# Patient Record
Sex: Female | Born: 1940 | Race: Black or African American | Hispanic: No | Marital: Single | Smoking: Never smoker
Health system: Southern US, Community
[De-identification: ages and names within clinical notes are randomized; demographics above are authoritative.]

## PROBLEM LIST (undated history)

## (undated) DIAGNOSIS — C259 Malignant neoplasm of pancreas, unspecified: Secondary | ICD-10-CM

## (undated) DIAGNOSIS — Z808 Family history of malignant neoplasm of other organs or systems: Secondary | ICD-10-CM

## (undated) DIAGNOSIS — Z8042 Family history of malignant neoplasm of prostate: Secondary | ICD-10-CM

## (undated) HISTORY — DX: Family history of malignant neoplasm of other organs or systems: Z80.8

## (undated) HISTORY — DX: Family history of malignant neoplasm of prostate: Z80.42

---

## 2021-07-03 NOTE — Progress Notes (Signed)
I spoke with Barbara Byrd.  I introduced myself and let her know we have received a referral from Dr Sheila Oats office for f/u of her history of pancreatic cancer.  I told her we need her records from her previous oncologist for review prior to scheduling her appt.  I provided my direct fax number.  She states she will call her previous oncologists office and request records to be faxed.

## 2021-07-17 NOTE — Progress Notes (Signed)
I spoke with Tillie Rung the referral coordinator at Dr Sheila Oats office.  Letting her know we have reached out to [facility] requesting her medical records regarding her cancer care in [place].  I let Tillie Rung know we have not received those records.  She has sent a message to Dr Donnamarie Poag and his nurse.  I provided my contact information.

## 2021-07-18 ENCOUNTER — Other Ambulatory Visit: Payer: Self-pay

## 2021-07-18 ENCOUNTER — Ambulatory Visit: Payer: Medicare HMO | Admitting: Podiatry

## 2021-07-18 ENCOUNTER — Encounter: Payer: Self-pay | Admitting: Podiatry

## 2021-07-18 DIAGNOSIS — M79674 Pain in right toe(s): Secondary | ICD-10-CM | POA: Diagnosis not present

## 2021-07-18 DIAGNOSIS — M79675 Pain in left toe(s): Secondary | ICD-10-CM | POA: Diagnosis not present

## 2021-07-18 DIAGNOSIS — B351 Tinea unguium: Secondary | ICD-10-CM | POA: Diagnosis not present

## 2021-07-18 NOTE — Progress Notes (Signed)
This patient presents to the office for evaluation and treatment of long thick painful nails .  This patient is unable to trim her own nails since the patient cannot reach her feet.  Patient says the nails are painful walking and wearing his shoes. She has moved from [place] to this area.  She presents for preventive foot care services.  General Appearance  Alert, conversant and in no acute stress.  Vascular  Dorsalis pedis and posterior tibial  pulses are palpable  bilaterally.  Capillary return is within normal limits  bilaterally. Temperature is within normal limits  bilaterally.  Neurologic  Senn-Weinstein monofilament wire test within normal limits  bilaterally. Muscle power within normal limits bilaterally.  Nails Thick disfigured discolored nails with subungual debris  from hallux to fifth toes bilaterally. No evidence of bacterial infection or drainage bilaterally.  Orthopedic  No limitations of motion  feet .  No crepitus or effusions noted.  No bony pathology or digital deformities noted.  Skin  normotropic skin with no porokeratosis noted bilaterally.  No signs of infections or ulcers noted.     Onychomycosis  Pain in toes right foot  Pain in toes left foot  Debridement  of nails  1-5  B/L with a nail nipper.  Nails were then filed using a dremel tool with no incidents.    RTC  3 months    Gardiner Barefoot DPM

## 2021-08-12 ENCOUNTER — Encounter: Payer: Self-pay | Admitting: *Deleted

## 2021-08-13 ENCOUNTER — Encounter: Payer: Self-pay | Admitting: *Deleted

## 2021-08-13 NOTE — Progress Notes (Signed)
Barbara Byrd brought her health records to [facility], will review with Dr. Benay Spice and set up appointment to establish care

## 2021-08-27 ENCOUNTER — Ambulatory Visit
Admission: RE | Admit: 2021-08-27 | Discharge: 2021-08-27 | Disposition: A | Discharge status: Home / Self Care | Payer: Self-pay | Source: Ambulatory Visit | Attending: Oncology | Admitting: Oncology

## 2021-08-27 ENCOUNTER — Other Ambulatory Visit: Payer: Self-pay | Admitting: *Deleted

## 2021-08-27 DIAGNOSIS — C259 Malignant neoplasm of pancreas, unspecified: Secondary | ICD-10-CM

## 2021-09-03 ENCOUNTER — Encounter: Payer: Self-pay | Admitting: *Deleted

## 2021-09-03 NOTE — Progress Notes (Signed)
Called and confirmed appt with patient for tomorrow at 1:40. Also received message from [facility] radiology that they received CD of images from [place] and are uploading them into [facility]. As I have been unable to obtain treatment records from [place], Barbara Byrd gave me Dr Jerline Pain cell phone number and called that number this am and left voicemail for records to be faxed ASAP. Will continue to follow

## 2021-09-04 ENCOUNTER — Inpatient Hospital Stay: Payer: Medicare HMO | Attending: Oncology | Admitting: Oncology

## 2021-09-04 ENCOUNTER — Other Ambulatory Visit: Payer: Self-pay

## 2021-09-04 ENCOUNTER — Inpatient Hospital Stay: Payer: Medicare HMO

## 2021-09-04 ENCOUNTER — Encounter: Payer: Self-pay | Admitting: *Deleted

## 2021-09-04 ENCOUNTER — Other Ambulatory Visit: Payer: Self-pay | Admitting: *Deleted

## 2021-09-04 VITALS — BP 147/71 | HR 67 | Temp 98.2°F | Resp 18 | Ht 64.0 in | Wt 275.6 lb

## 2021-09-04 DIAGNOSIS — C221 Intrahepatic bile duct carcinoma: Secondary | ICD-10-CM | POA: Diagnosis not present

## 2021-09-04 DIAGNOSIS — C259 Malignant neoplasm of pancreas, unspecified: Secondary | ICD-10-CM

## 2021-09-04 DIAGNOSIS — Z8616 Personal history of COVID-19: Secondary | ICD-10-CM

## 2021-09-04 DIAGNOSIS — Z90710 Acquired absence of both cervix and uterus: Secondary | ICD-10-CM

## 2021-09-04 DIAGNOSIS — I10 Essential (primary) hypertension: Secondary | ICD-10-CM | POA: Diagnosis not present

## 2021-09-04 DIAGNOSIS — Z79899 Other long term (current) drug therapy: Secondary | ICD-10-CM | POA: Diagnosis not present

## 2021-09-04 DIAGNOSIS — E785 Hyperlipidemia, unspecified: Secondary | ICD-10-CM | POA: Insufficient documentation

## 2021-09-04 NOTE — Progress Notes (Signed)
efer

## 2021-09-04 NOTE — Progress Notes (Signed)
Met with Barbara Byrd during her visit with Dr Benay Spice, after today's visit we have arranged for Genetic counseling on 10/6, CA 19.9 lab today and referral to IR for Port Removal. She will F/U with Dr Benay Spice in 6 months or sooner if needed. She has my contact information.

## 2021-09-04 NOTE — Progress Notes (Signed)
[facility] New Patient Consult   Requesting MD: Malena Peer, Md [place],  [place] [address]   [name] 80 y.o.  1941-01-19    Reason for Consult: Cholangiocarcinoma   HPI: (Medical records provided by the patient) Barbara Byrd reports developing difficulty swallowing and jaundice in the summer 2019.  An MRI on 07/02/2018 for evaluation of elevated liver function test revealed moderate intrahepatic biliary dilation and abrupt stricture of a dilated common bile duct.  An MRCP revealed a high-grade stricture of the distal common bile duct causing significant intra hepatic and extrahepatic biliary dilation without a dilated pancreatic duct.  An ERCP/EUS procedure on 09/02/2018 revealed a single stenosis in the lower third of the main bile duct.  The bile duct was diffusely dilated.  A stent was placed in the common bile duct.  A mass was identified in the uncinate process of the pancreas.  The borders were poorly defined.  A biopsy of the pancreas uncinate mass revealed a rare minute cluster of atypical cells.  She underwent a Whipple procedure on 11/15/2018.  The tumor involved the common bile duct and measure 2.1 cm.  The tumor was a moderately differentiated adenocarcinoma invading the duodenum and pancreas.  Resection margins are negative.  Perineural invasion was present.  No lymphovascular invasion.  2/35 lymph nodes contained metastatic carcinoma.  The tumor was staged as a pT2pN1 of the common bile duct.  She was treated with adjuvant gemcitabine/capecitabine for 6 months (we do not have the chemotherapy records available today).  She reports tolerating the treatment well.  A Port-A-Cath remains in place.  The Port-A-Cath has not been flushed in months. Barbara Byrd recently relocated to [facility] and is referred for oncology care.  Past medical history: "Arthritis "of the knees Hyperlipidemia Hypertension G2 Ab2 COVID infection in 2021 History  of a right breast biopsy  Past surgical history: Hysterectomy Pancreaticoduodenectomy December 2019  Medications: Reviewed  Allergies: No Known Allergies  Family history: No family history of cancer  Social History:   She lives with 3 sisters in [place].  She is retired Radio producer.  She does not use cigarettes.  She reports social alcohol use.  She was transfused at the time of pancreas surgery.  No risk factor for HIV or hepatitis.  She has received COVID-19 vaccines  ROS:   Positives include: Intermittent "sweats "during the day and at night, bilateral knee and foot pain when ambulating, decreased visual acuity  A complete ROS was otherwise negative.  Physical Exam:  Blood pressure (!) 147/71, pulse 67, temperature 98.2 F (36.8 C), temperature source Oral, resp. rate 18, height 5\' 4"  (1.626 m), weight 275 lb 9.6 oz (125 kg), SpO2 99 %.  HEENT: Neck without mass Lungs: Coarse end inspiratory rhonchi at the lower posterior chest bilaterally, no respiratory distress Cardiac: Regular rate and rhythm Abdomen: No mass, no hepatosplenomegaly, nontender  Vascular: No leg edema Lymph nodes: No cervical, supraclavicular, axillary, or inguinal nodes Neurologic: Alert and oriented, motor exam appears intact in the upper and lower extremities bilaterally Skin: No rash Musculoskeletal: No spine tenderness Right upper chest Port-A-Cath without erythema  Lab: CA 19-9: 09/19/18-184    07/18/20- 12.6  CBC 07/16/2020-heme-onc 12.8, MCV 93, platelets 223,000, white count 7.6, ANC 4.6  Assessment/Plan:   Common bile duct cholangiocarcinoma-stage IIb,pT2pN1 Presenting with obstructive jaundice summer 2019 Elevated preoperative CA 19-9 ERCP/EUS 09/02/2018-stenosis and lower third of the main bile duct, mass in the uncinate of the pancreas,-biopsy revealed  minute focus of atypical cells Pancreaticoduodenectomy 11/15/2018,pT2pN1 moderately differentiated adenocarcinoma of the common bile  duct, 2/35 lymph nodes, perineural invasion Patient reports 6 months of adjuvant gemcitabine/capecitabine 2.  Hypertension 3.  Hyperlipidemia 4.  COVID-19 2021  Disposition:   Barbara Byrd was diagnosed with adenocarcinoma the extrahepatic bile duct when she underwent a pancreaticoduodenectomy on 11/15/2018.  She completed adjuvant chemotherapy and is in clinical remission.  She is almost 3 years out from diagnosis.  We will obtain a CA 19-9 today.  She will be referred for removal of the Port-A-Cath.  Barbara Byrd will return for an office visit in 6 months.  I encouraged her to obtain a mammogram and influenza vaccine.  [name], MD  09/04/2021, 1:59 PM

## 2021-09-05 LAB — CANCER ANTIGEN 19-9: CA 19-9: 58 U/mL — ABNORMAL HIGH (ref 0–35)

## 2021-09-08 ENCOUNTER — Telehealth: Payer: Self-pay

## 2021-09-08 NOTE — Telephone Encounter (Signed)
-----   Message from Ladell Pier, MD sent at 09/05/2021  3:57 PM EDT ----- Please call patient, the tumor marker for bile duct cancer is very mildly elevated, likely a benign nonspecific finding, repeat CA 19-9 and office visit in 2-3 months

## 2021-09-11 ENCOUNTER — Other Ambulatory Visit: Payer: Self-pay | Admitting: Genetic Counselor

## 2021-09-11 ENCOUNTER — Inpatient Hospital Stay: Payer: Medicare HMO

## 2021-09-11 ENCOUNTER — Encounter: Payer: Self-pay | Admitting: Genetic Counselor

## 2021-09-11 ENCOUNTER — Inpatient Hospital Stay: Payer: Medicare HMO | Attending: Oncology | Admitting: Genetic Counselor

## 2021-09-11 ENCOUNTER — Other Ambulatory Visit: Payer: Self-pay

## 2021-09-11 DIAGNOSIS — C221 Intrahepatic bile duct carcinoma: Secondary | ICD-10-CM

## 2021-09-11 DIAGNOSIS — Z808 Family history of malignant neoplasm of other organs or systems: Secondary | ICD-10-CM | POA: Diagnosis not present

## 2021-09-11 DIAGNOSIS — Z8042 Family history of malignant neoplasm of prostate: Secondary | ICD-10-CM

## 2021-09-11 NOTE — Progress Notes (Signed)
REFERRING PROVIDER: Ladell Pier, MD [place],  [place] [address]  PRIMARY PROVIDER:  Malena Peer, MD  PRIMARY REASON FOR VISIT:  1. Cholangiocarcinoma ([place])   2. Family history of cancer of pituitary gland and craniopharyngeal duct   3. Family history of prostate cancer      HISTORY OF PRESENT ILLNESS:   Barbara Byrd, a 80 y.o. female, was seen for a [place] cancer genetics consultation at the request of Dr. Benay Spice due to a personal and family history of cancer.  Ms. Ahlgrim presents to clinic today to discuss the possibility of a hereditary predisposition to cancer, genetic testing, and to further clarify her future cancer risks, as well as potential cancer risks for family members.   In 2019, at the age of 32, Ms. Corne was diagnosed with adenocarcinoma of the head of the pancreas. The treatment plan included chemotherapy and a whipple procedure.      CANCER HISTORY:  Oncology History  Cholangiocarcinoma ([place])  09/04/2021 Initial Diagnosis   Cholangiocarcinoma ([place])   09/04/2021 Cancer Staging   Staging form: Distal Bile Duct, AJCC 8th Edition - Pathologic: Stage IIB (pT2, pN1, cM0) - Signed by Ladell Pier, MD on 09/04/2021 Histologic grade (G): G2 Histologic grading system: 3 grade system Residual tumor (R): R0 - None Organs invaded adjacent to distal bile duct: Duodenum, Pancreas Perineural invasion (PNI): Present      RISK FACTORS:  Menarche was at age 30-13.  First live birth at age N/A.  OCP use for approximately 0 years.  Ovaries intact: yes.  Hysterectomy: yes.  Menopausal status: postmenopausal.  HRT use: 0 years. Colonoscopy: yes;  some polyps . Mammogram within the last year: yes. Number of breast biopsies: 2. Up to date with pelvic exams: yes. Any excessive radiation exposure in the past: no  Past Medical History:  Diagnosis Date   Family history of cancer of pituitary gland and craniopharyngeal duct    Family  history of prostate cancer     No past surgical history on file.  Social History   Socioeconomic History   Marital status: Single    Spouse name: Not on file   Number of children: Not on file   Years of education: Not on file   Highest education level: Not on file  Occupational History   Not on file  Tobacco Use   Smoking status: Never   Smokeless tobacco: Not on file  Vaping Use   Vaping Use: Never used  Substance and Sexual Activity   Alcohol use: Yes    Comment: socially   Drug use: Never   Sexual activity: Not on file  Other Topics Concern   Not on file  Social History Narrative   Not on file   Social Determinants of Health   Financial Resource Strain: Not on file  Food Insecurity: Not on file  Transportation Needs: Not on file  Physical Activity: Not on file  Stress: Not on file  Social Connections: Not on file     FAMILY HISTORY:  We obtained a detailed, 4-generation family history.  Significant diagnoses are listed below: Family History  Problem Relation Age of Onset   Heart disease Mother 73   Cerebral aneurysm Father 17   Brain cancer Brother 88       pituitary tumor   Stroke Maternal Grandmother    Brain cancer Nephew 38       pituitary gland   Prostate cancer Nephew 14  The patient had 5 sisters and 3 brothers. One brother had a pituitary gland tumor in his late 79's and his son also had that same tumor in his late 86's.  One sister has a son who had prostate cancer at 62, and another brother has a son who had possible colon cancer vs other bowel disease necessitating a colostomy.  The parents are both deceased.  The patients mother died at 44 from heart diease.  She had two sisters and a brother who did not have cancer but one sister's daughter had cancer NOS.  The maternal grandparents died of old age and a stroke.  The patient's father died at 76 from a cerebral hemorrhage.  He had 6 siblings who are reportedly cancer free.  His parents died  of unknown causes.  Ms. Botto is unaware of previous family history of genetic testing for hereditary cancer risks. Patient's maternal ancestors are of African American descent, and paternal ancestors are of African American descent. There is no reported Ashkenazi Jewish ancestry. There is no known consanguinity.  GENETIC COUNSELING ASSESSMENT: Ms. Yeske is a 80 y.o. female with a personal and family history of cancer which is somewhat suggestive of a hereditary cancer syndrome and predisposition to cancer given her diagnosis of pancreatic cancer combined with other cancers in the family, some at young ages. We, therefore, discussed and recommended the following at today's visit.   DISCUSSION: We discussed that, in general, most cancer is not inherited in families, but instead is sporadic or familial. Sporadic cancers occur by chance and typically happen at older ages (>50 years) as this type of cancer is caused by genetic changes acquired during an individual's lifetime. Some families have more cancers than would be expected by chance; however, the ages or types of cancer are not consistent with a known genetic mutation or known genetic mutations have been ruled out. This type of familial cancer is thought to be due to a combination of multiple genetic, environmental, hormonal, and lifestyle factors. While this combination of factors likely increases the risk of cancer, the exact source of this risk is not currently identifiable or testable.  We discussed that up to 15% of pancreatic cancer is hereditary, with most cases associated with BRCA mutations.  There are other genes that can be associated with hereditary pancreatic cancer syndromes.  These include ATM and PALB2.  We discussed that testing is beneficial for several reasons including knowing how to follow individuals after completing their treatment, identifying whether potential treatment options such as PARP inhibitors would be beneficial, and  understand if other family members could be at risk for cancer and allow them to undergo genetic testing.   We reviewed the characteristics, features and inheritance patterns of hereditary cancer syndromes. We also discussed genetic testing, including the appropriate family members to test, the process of testing, insurance coverage and turn-around-time for results. We discussed the implications of a negative, positive, carrier and/or variant of uncertain significant result. We recommended Ms. Brune pursue genetic testing for the CancerNext-Expanded+RNAinsight gene panel.   The CancerNext-Expanded gene panel offered by [facility] and includes sequencing and rearrangement analysis for the following 77 genes: AIP, ALK, APC*, ATM*, AXIN2, BAP1, BARD1, BLM, BMPR1A, BRCA1*, BRCA2*, BRIP1*, CDC73, CDH1*, CDK4, CDKN1B, CDKN2A, CHEK2*, CTNNA1, DICER1, FANCC, FH, FLCN, GALNT12, KIF1B, LZTR1, MAX, MEN1, MET, MLH1*, MSH2*, MSH3, MSH6*, MUTYH*, NBN, NF1*, NF2, NTHL1, PALB2*, PHOX2B, PMS2*, POT1, PRKAR1A, PTCH1, PTEN*, RAD51C*, RAD51D*, RB1, RECQL, RET, SDHA, SDHAF2, SDHB, SDHC, SDHD, SMAD4, SMARCA4, SMARCB1,  SMARCE1, STK11, SUFU, TMEM127, TP53*, TSC1, TSC2, VHL and XRCC2 (sequencing and deletion/duplication); EGFR, EGLN1, HOXB13, KIT, MITF, PDGFRA, POLD1, and POLE (sequencing only); EPCAM and GREM1 (deletion/duplication only). DNA and RNA analyses performed for * genes.   Based on Ms. Giovannetti's personal and family history of cancer, she meets medical criteria for genetic testing. Despite that she meets criteria, she may still have an out of pocket cost. We discussed that if her out of pocket cost for testing is over $100, the laboratory will call and confirm whether she wants to proceed with testing.  If the out of pocket cost of testing is less than $100 she will be billed by the genetic testing laboratory.   PLAN: After considering the risks, benefits, and limitations, Ms. Tigges provided informed consent to  pursue genetic testing and the blood sample was sent to [facility] for analysis of the CancerNext-Expanded+RNAinsight. Results should be available within approximately 2-3 weeks' time, at which point they will be disclosed by telephone to Ms. Barbar, as will any additional recommendations warranted by these results. Ms. Lapaglia will receive a summary of her genetic counseling visit and a copy of her results once available. This information will also be available in Epic.   Lastly, we encouraged Ms. Kirsh to remain in contact with cancer genetics annually so that we can continuously update the family history and inform her of any changes in cancer genetics and testing that may be of benefit for this family.   Ms. Spohr questions were answered to her satisfaction today. Our contact information was provided should additional questions or concerns arise. Thank you for the referral and allowing [place] to share in the care of your patient.   Lenyx Boody P. [place], [place], [facility] Licensed, Insurance risk surveyor [email or web address] phone: [phone]  The patient was seen for a total of 40 minutes in face-to-face genetic counseling.  The patient brought her sister. This patient was discussed with Drs. Magrinat, Lindi Adie and/or Burr Medico who agrees with the above.    _______________________________________________________________________ For Office Staff:  Number of people involved in session: 2 Was an Intern/ student involved with case: no

## 2021-09-12 LAB — GENETIC SCREENING ORDER

## 2021-09-25 ENCOUNTER — Encounter: Payer: Self-pay | Admitting: Genetic Counselor

## 2021-09-25 ENCOUNTER — Telehealth: Payer: Self-pay | Admitting: Genetic Counselor

## 2021-09-25 ENCOUNTER — Ambulatory Visit: Payer: Self-pay | Admitting: Genetic Counselor

## 2021-09-25 DIAGNOSIS — Z1379 Encounter for other screening for genetic and chromosomal anomalies: Secondary | ICD-10-CM | POA: Insufficient documentation

## 2021-09-25 NOTE — Progress Notes (Signed)
HPI:  Ms. Virtue was previously seen in the [place] clinic due to a personal and family history of cancer and concerns regarding a hereditary predisposition to cancer. Please refer to our prior cancer genetics clinic note for more information regarding our discussion, assessment and recommendations, at the time. Ms. Shaub recent genetic test results were disclosed to her, as were recommendations warranted by these results. These results and recommendations are discussed in more detail below.  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  The report date is September 23, 2021.  The CancerNext-Expanded gene panel offered by [facility] and includes sequencing and rearrangement analysis for the following 77 genes: AIP, ALK, APC*, ATM*, AXIN2, BAP1, BARD1, BLM, BMPR1A, BRCA1*, BRCA2*, BRIP1*, CDC73, CDH1*, CDK4, CDKN1B, CDKN2A, CHEK2*, CTNNA1, DICER1, FANCC, FH, FLCN, GALNT12, KIF1B, LZTR1, MAX, MEN1, MET, MLH1*, MSH2*, MSH3, MSH6*, MUTYH*, NBN, NF1*, NF2, NTHL1, PALB2*, PHOX2B, PMS2*, POT1, PRKAR1A, PTCH1, PTEN*, RAD51C*, RAD51D*, RB1, RECQL, RET, SDHA, SDHAF2, SDHB, SDHC, SDHD, SMAD4, SMARCA4, SMARCB1, SMARCE1, STK11, SUFU, TMEM127, TP53*, TSC1, TSC2, VHL and XRCC2 (sequencing and deletion/duplication); EGFR, EGLN1, HOXB13, KIT, MITF, PDGFRA, POLD1, and POLE (sequencing only); EPCAM and GREM1 (deletion/duplication only). DNA and RNA analyses performed for *  genes.      FAMILY HISTORY:  We obtained a detailed, 4-generation family history.  Significant diagnoses are listed below: Family History  Problem Relation Age of Onset   Heart disease Mother 75   Cerebral aneurysm Father 88   Brain cancer Brother 67       pituitary tumor   Stroke Maternal Grandmother    Brain cancer Nephew 72       pituitary gland   Prostate cancer Nephew 46     The patient had 5 sisters and 3 brothers. One brother had a pituitary gland tumor in his late 22's and his son also had that same tumor in his late 67's.  One sister has a son who had prostate cancer at 75, and another brother has a son who had possible colon cancer vs other bowel disease necessitating a colostomy.  The parents are both deceased.   The patients mother died at 41 from heart diease.  She had two sisters and a brother who did not have cancer but one sister's daughter had cancer NOS.  The maternal grandparents died of old age and a stroke.   The patient's father died at 49 from a cerebral hemorrhage.  He had 6 siblings who are reportedly cancer free.  His parents died of unknown causes.   Ms. Mottley is unaware of previous family history of genetic testing for hereditary cancer risks. Patient's maternal ancestors are of African American descent, and paternal ancestors are of African American descent. There is no reported Ashkenazi Jewish ancestry. There is no known consanguinity.   GENETIC TEST RESULTS: Genetic testing reported out on September 23, 2021 through the CancerNext-Expanded+RNAinsight cancer panel found no pathogenic mutations. The CancerNext-Expanded gene panel offered by [facility] and includes sequencing and rearrangement analysis for the following  77 genes: AIP, ALK, APC*, ATM*, AXIN2, BAP1, BARD1, BLM, BMPR1A, BRCA1*, BRCA2*, BRIP1*, CDC73, CDH1*, CDK4, CDKN1B, CDKN2A, CHEK2*, CTNNA1, DICER1, FANCC, FH, FLCN, GALNT12, KIF1B, LZTR1, MAX, MEN1, MET, MLH1*, MSH2*, MSH3, MSH6*, MUTYH*,  NBN, NF1*, NF2, NTHL1, PALB2*, PHOX2B, PMS2*, POT1, PRKAR1A, PTCH1, PTEN*, RAD51C*, RAD51D*, RB1, RECQL, RET, SDHA, SDHAF2, SDHB, SDHC, SDHD, SMAD4, SMARCA4, SMARCB1, SMARCE1, STK11, SUFU, TMEM127, TP53*, TSC1, TSC2, VHL and XRCC2 (sequencing and deletion/duplication); EGFR, EGLN1, HOXB13, KIT, MITF, PDGFRA, POLD1, and POLE (sequencing only); EPCAM and GREM1 (deletion/duplication only). DNA and RNA analyses performed for * gene. The test report has been scanned into EPIC and is located under the Molecular Pathology section of the Results Review tab.  A portion of the result report is included below for reference.     We discussed with Ms. Walmer that because current genetic testing is not perfect, it is possible there may be a gene mutation in one of these genes that current testing cannot detect, but that chance is small.  We also discussed, that there could be another gene that has not yet been discovered, or that we have not yet tested, that is responsible for the cancer diagnoses in the family. It is also possible there is a hereditary cause for the cancer in the family that Ms. Moster did not inherit and therefore was not identified in her testing.  Therefore, it is important to remain in touch with cancer genetics in the future so that we can continue to offer Ms. Severt the most up to date genetic testing.   ADDITIONAL GENETIC TESTING: We discussed with Ms. Krogh that her genetic testing was fairly extensive.  If there are genes identified to increase cancer risk that can be analyzed in the future, we would be happy to discuss and coordinate this testing at that time.    CANCER SCREENING RECOMMENDATIONS: Ms. Printup test result is considered negative (normal).  This means that we have not identified a hereditary cause for her personal and family history of cancer at this time. Most cancers happen by chance and this negative test suggests that her cancer may fall into this category.    While  reassuring, this does not definitively rule out a hereditary predisposition to cancer. It is still possible that there could be genetic mutations that are undetectable by current technology. There could be genetic mutations in genes that have not been tested or identified to increase cancer risk.  Therefore, it is recommended she continue to follow the cancer management and screening guidelines provided by her oncology and primary healthcare provider.   An individual's cancer risk and medical management are not determined by genetic test results alone. Overall cancer risk assessment incorporates additional factors, including personal medical history, family history, and any available genetic information that may result in a personalized plan for cancer prevention and surveillance  RECOMMENDATIONS FOR FAMILY MEMBERS:  Individuals in this family might be at some increased risk of developing cancer, over the general population risk, simply due to the family history of cancer.  We recommended women in this family have a yearly mammogram beginning at age 90, or 64 years younger than the earliest onset of cancer, an annual clinical breast exam, and perform monthly breast self-exams. Women in this family should also have a gynecological exam as recommended by their primary provider. All family members should be referred for colonoscopy starting at age 51.  FOLLOW-UP: Lastly, we discussed with Ms. Circle that cancer genetics is a rapidly advancing field and it  is possible that new genetic tests will be appropriate for her and/or her family members in the future. We encouraged her to remain in contact with cancer genetics on an annual basis so we can update her personal and family histories and let her know of advances in cancer genetics that may benefit this family.   Our contact number was provided. Ms. Varone questions were answered to her satisfaction, and she knows she is welcome to call us at anytime with  additional questions or concerns.   Roma Kayser, [place], [facility] Licensed, Certified Genetic Counselor [email or web address]

## 2021-09-25 NOTE — Telephone Encounter (Signed)
Revealed negative genetic testing.  Discussed that we do not know why she has bile duct cancer or why there is cancer in the family. It could be due to a different gene that we are not testing, or maybe our current technology may not be able to pick something up.  It will be important for her to keep in contact with genetics to keep up with whether additional testing may be needed.

## 2021-10-01 ENCOUNTER — Encounter: Payer: Self-pay | Admitting: Podiatry

## 2021-10-01 ENCOUNTER — Ambulatory Visit: Payer: Medicare HMO | Admitting: Podiatry

## 2021-10-01 ENCOUNTER — Other Ambulatory Visit: Payer: Self-pay

## 2021-10-01 DIAGNOSIS — B351 Tinea unguium: Secondary | ICD-10-CM

## 2021-10-01 DIAGNOSIS — M79675 Pain in left toe(s): Secondary | ICD-10-CM | POA: Diagnosis not present

## 2021-10-01 DIAGNOSIS — M79674 Pain in right toe(s): Secondary | ICD-10-CM | POA: Diagnosis not present

## 2021-10-01 NOTE — Progress Notes (Signed)
This patient presents to the office for evaluation and treatment of long thick painful nails .  This patient is unable to trim her own nails since the patient cannot reach her feet.  Patient says the nails are painful walking and wearing his shoes.  She presents for preventive foot care services.  General Appearance  Alert, conversant and in no acute stress.  Vascular  Dorsalis pedis and posterior tibial  pulses are palpable  bilaterally.  Capillary return is within normal limits  bilaterally. Temperature is within normal limits  bilaterally.  Neurologic  Senn-Weinstein monofilament wire test within normal limits  bilaterally. Muscle power within normal limits bilaterally.  Nails Thick disfigured discolored nails with subungual debris  from hallux to fifth toes bilaterally. No evidence of bacterial infection or drainage bilaterally.  Orthopedic  No limitations of motion  feet .  No crepitus or effusions noted.  No bony pathology or digital deformities noted.  Skin  normotropic skin with no porokeratosis noted bilaterally.  No signs of infections or ulcers noted.     Onychomycosis  Pain in toes right foot  Pain in toes left foot  Debridement  of nails  1-5  B/L with a nail nipper.  Nails were then filed using a dremel tool with no incidents.    RTC  3 months    Zakhari Fogel DPM   

## 2021-11-12 ENCOUNTER — Other Ambulatory Visit: Payer: Self-pay | Admitting: Student

## 2021-11-14 ENCOUNTER — Ambulatory Visit (HOSPITAL_COMMUNITY)
Admission: RE | Admit: 2021-11-14 | Discharge: 2021-11-14 | Disposition: A | Discharge status: Home / Self Care | Payer: Medicare HMO | Source: Ambulatory Visit | Attending: Oncology | Admitting: Oncology

## 2021-11-14 ENCOUNTER — Other Ambulatory Visit: Payer: Self-pay

## 2021-11-14 DIAGNOSIS — C259 Malignant neoplasm of pancreas, unspecified: Secondary | ICD-10-CM | POA: Insufficient documentation

## 2021-11-14 DIAGNOSIS — Z452 Encounter for adjustment and management of vascular access device: Secondary | ICD-10-CM | POA: Diagnosis present

## 2021-11-14 HISTORY — PX: IR REMOVAL TUN ACCESS W/ PORT W/O FL MOD SED: IMG2290

## 2021-11-14 MED ORDER — LIDOCAINE-EPINEPHRINE (PF) 2 %-1:200000 IJ SOLN
INTRAMUSCULAR | Status: AC
  Administered 2021-11-14: 10 mL
  Filled 2021-11-14: qty 20

## 2022-01-07 ENCOUNTER — Other Ambulatory Visit: Payer: Self-pay

## 2022-01-07 ENCOUNTER — Encounter: Payer: Self-pay | Admitting: Podiatry

## 2022-01-07 ENCOUNTER — Ambulatory Visit: Payer: Medicare HMO | Admitting: Podiatry

## 2022-01-07 DIAGNOSIS — M79674 Pain in right toe(s): Secondary | ICD-10-CM

## 2022-01-07 DIAGNOSIS — M79675 Pain in left toe(s): Secondary | ICD-10-CM | POA: Diagnosis not present

## 2022-01-07 DIAGNOSIS — B351 Tinea unguium: Secondary | ICD-10-CM | POA: Diagnosis not present

## 2022-01-07 NOTE — Progress Notes (Signed)
This patient presents to the office for evaluation and treatment of long thick painful nails .  This patient is unable to trim her own nails since the patient cannot reach her feet.  Patient says the nails are painful walking and wearing his shoes.  She presents for preventive foot care services.  General Appearance  Alert, conversant and in no acute stress.  Vascular  Dorsalis pedis and posterior tibial  pulses are palpable  bilaterally.  Capillary return is within normal limits  bilaterally. Temperature is within normal limits  bilaterally.  Neurologic  Senn-Weinstein monofilament wire test within normal limits  bilaterally. Muscle power within normal limits bilaterally.  Nails Thick disfigured discolored nails with subungual debris  from hallux to fifth toes bilaterally. No evidence of bacterial infection or drainage bilaterally.  Orthopedic  No limitations of motion  feet .  No crepitus or effusions noted.  No bony pathology or digital deformities noted.  Skin  normotropic skin with no porokeratosis noted bilaterally.  No signs of infections or ulcers noted.     Onychomycosis  Pain in toes right foot  Pain in toes left foot  Debridement  of nails  1-5  B/L with a nail nipper.  Nails were then filed using a dremel tool with no incidents.    RTC  3 months    Fabion Gatson DPM   

## 2022-02-23 ENCOUNTER — Inpatient Hospital Stay: Payer: Medicare HMO | Attending: Oncology

## 2022-02-23 ENCOUNTER — Encounter: Payer: Self-pay | Admitting: Nurse Practitioner

## 2022-02-23 ENCOUNTER — Inpatient Hospital Stay: Payer: Medicare HMO | Admitting: Nurse Practitioner

## 2022-02-23 ENCOUNTER — Other Ambulatory Visit: Payer: Self-pay

## 2022-02-23 VITALS — BP 135/70 | HR 67 | Temp 98.1°F | Resp 18 | Ht 64.0 in | Wt 275.0 lb

## 2022-02-23 DIAGNOSIS — R1011 Right upper quadrant pain: Secondary | ICD-10-CM | POA: Insufficient documentation

## 2022-02-23 DIAGNOSIS — E785 Hyperlipidemia, unspecified: Secondary | ICD-10-CM | POA: Insufficient documentation

## 2022-02-23 DIAGNOSIS — Z9221 Personal history of antineoplastic chemotherapy: Secondary | ICD-10-CM | POA: Insufficient documentation

## 2022-02-23 DIAGNOSIS — C221 Intrahepatic bile duct carcinoma: Secondary | ICD-10-CM | POA: Diagnosis not present

## 2022-02-23 DIAGNOSIS — Z8505 Personal history of malignant neoplasm of liver: Secondary | ICD-10-CM | POA: Diagnosis not present

## 2022-02-23 DIAGNOSIS — I10 Essential (primary) hypertension: Secondary | ICD-10-CM | POA: Diagnosis not present

## 2022-02-23 DIAGNOSIS — R918 Other nonspecific abnormal finding of lung field: Secondary | ICD-10-CM | POA: Diagnosis not present

## 2022-02-23 DIAGNOSIS — C259 Malignant neoplasm of pancreas, unspecified: Secondary | ICD-10-CM

## 2022-02-23 DIAGNOSIS — R978 Other abnormal tumor markers: Secondary | ICD-10-CM | POA: Insufficient documentation

## 2022-02-23 NOTE — Progress Notes (Signed)
?  [facility] ?OFFICE PROGRESS NOTE ? ? ?Diagnosis: Cholangiocarcinoma ? ?INTERVAL HISTORY:  ? ?Ms. Mccrackin contacted the office last week to request a sooner appointment to evaluate abdominal pain.  She reports fairly constant right upper to mid abdominal pain for the past 2 weeks.  Pain is described as dull.  Oral intake does not affect the pain.  Bowels moving regularly.  No bleeding.  No fever.  No nausea or vomiting.  Appetite is poor.  She is more fatigued.  She has noted dizziness at times. ? ?Objective: ? ?Vital signs in last 24 hours: ? ?Blood pressure 135/70, pulse 67, temperature 98.1 ?F (36.7 ?C), temperature source Oral, resp. rate 18, height '5\' 4"'$  (1.626 m), weight 275 lb (124.7 kg), SpO2 100 %. ?  ? ?HEENT: No thrush or ulcers.  Sclera anicteric. ?Lymphatics: No palpable cervical, supraclavicular or axillary lymph nodes. ?Resp: Lungs clear bilaterally. ?Cardio: Regular rate and rhythm. ?GI: Abdomen is soft.  Nontender.  No hepatosplenomegaly.  No mass. ?Vascular: No leg edema. ? ? ?Lab Results: ? ?No results found for: WBC, HGB, HCT, MCV, PLT, NEUTROABS ? ?Imaging: ? ?No results found. ? ?Medications: I have reviewed the patient's current medications. ? ?Assessment/Plan: ?Common bile duct cholangiocarcinoma-stage IIb,pT2pN1 ?Presenting with obstructive jaundice summer 2019 ?Elevated preoperative CA 19-9 ?ERCP/EUS 09/02/2018-stenosis and lower third of the main bile duct, mass in the uncinate of the pancreas,-biopsy revealed minute focus of atypical cells ?Pancreaticoduodenectomy 11/15/2018,pT2pN1 moderately differentiated adenocarcinoma of the common bile duct, 2/35 lymph nodes, perineural invasion ?Patient reports 6 months of adjuvant gemcitabine/capecitabine ?2.  Hypertension ?3.  Hyperlipidemia ?4.  COVID-19 2021 ? ?Disposition: Ms. Tolsma is now a little over 3 years out from a Whipple procedure for common bile duct cholangiocarcinoma, stage IIb.  She presents today with an approximate  2-week history of right upper to mid abdomen pain.  She   We discussed potential etiologies of the pain and decided to proceed with CT scans.  She will return for a follow-up visit next week to review results.  She declines pain medication. ? ? ? ?Ned Card ANP/GNP-BC  ? ?02/23/2022  ?1:42 PM ? ? ? ? ? ? ? ?

## 2022-02-24 LAB — CANCER ANTIGEN 19-9: CA 19-9: 551 U/mL — ABNORMAL HIGH (ref 0–35)

## 2022-02-27 ENCOUNTER — Telehealth: Payer: Self-pay

## 2022-02-27 ENCOUNTER — Inpatient Hospital Stay: Payer: Medicare HMO

## 2022-02-27 ENCOUNTER — Other Ambulatory Visit: Payer: Self-pay

## 2022-02-27 ENCOUNTER — Ambulatory Visit (HOSPITAL_BASED_OUTPATIENT_CLINIC_OR_DEPARTMENT_OTHER)
Admission: RE | Admit: 2022-02-27 | Discharge: 2022-02-27 | Disposition: A | Discharge status: Home / Self Care | Payer: Medicare HMO | Source: Ambulatory Visit | Attending: Nurse Practitioner | Admitting: Nurse Practitioner

## 2022-02-27 DIAGNOSIS — C221 Intrahepatic bile duct carcinoma: Secondary | ICD-10-CM | POA: Diagnosis present

## 2022-02-27 LAB — BASIC METABOLIC PANEL - CANCER CENTER ONLY
Anion gap: 10 (ref 5–15)
BUN: 12 mg/dL (ref 8–23)
CO2: 28 mmol/L (ref 22–32)
Calcium: 9.5 mg/dL (ref 8.9–10.3)
Chloride: 100 mmol/L (ref 98–111)
Creatinine: 0.89 mg/dL (ref 0.44–1.00)
GFR, Estimated: >60 mL/min (ref >60)
Glucose, Bld: 121 mg/dL — ABNORMAL HIGH (ref 70–99)
Potassium: 3.3 mmol/L — ABNORMAL LOW (ref 3.5–5.1)
Sodium: 138 mmol/L (ref 135–145)

## 2022-02-27 MED ORDER — IOHEXOL 300 MG/ML  SOLN
100.0000 mL | Freq: Once | INTRAMUSCULAR | Status: AC | PRN
  Administered 2022-02-27: 100 mL via INTRAVENOUS

## 2022-02-27 NOTE — Telephone Encounter (Signed)
-----   Message from Owens Shark, NP sent at 02/27/2022  1:18 PM EDT ----- ?Please forward lab from today to PCP.  She has mild hypokalemia.  Looks like she is on a diuretic. ?----- Message ----- ?From: Interface, Lab In [place] ?Sent: 02/27/2022   9:38 AM EDT ?To: Owens Shark, NP ? ? ?

## 2022-02-27 NOTE — Telephone Encounter (Signed)
Faxed over the lab result to Leota Sauers, MD at [phone] ?

## 2022-03-02 ENCOUNTER — Other Ambulatory Visit: Payer: Self-pay

## 2022-03-02 ENCOUNTER — Inpatient Hospital Stay: Payer: Medicare HMO | Admitting: Nurse Practitioner

## 2022-03-02 ENCOUNTER — Encounter: Payer: Self-pay | Admitting: Nurse Practitioner

## 2022-03-02 VITALS — BP 131/65 | HR 65 | Temp 98.7°F | Resp 18 | Ht 64.0 in | Wt 274.6 lb

## 2022-03-02 DIAGNOSIS — C221 Intrahepatic bile duct carcinoma: Secondary | ICD-10-CM

## 2022-03-02 DIAGNOSIS — Z8505 Personal history of malignant neoplasm of liver: Secondary | ICD-10-CM | POA: Diagnosis not present

## 2022-03-02 NOTE — Progress Notes (Signed)
?  [facility] ?OFFICE PROGRESS NOTE ? ? ?Diagnosis:  Cholangiocarcinoma ? ?INTERVAL HISTORY:  ? ?Barbara Byrd returns as scheduled.  She continues to have intermittent abdominal pain mainly located upper abdomen at times moving to the lower abdomen.  No nausea or vomiting.  Bowels moving regularly with Metamucil.  Overall good appetite. ? ?Objective: ? ?Vital signs in last 24 hours: ? ?Blood pressure 131/65, pulse 65, temperature 98.7 ?F (37.1 ?C), temperature source Oral, resp. rate 18, height '5\' 4"'$  (1.626 m), weight 274 lb 9.6 oz (124.6 kg), SpO2 99 %. ?  ? ?Resp: Lungs clear bilaterally. ?Cardio: Regular rate and rhythm. ?GI: Abdomen is soft.  Mild tenderness at the upper mid abdomen.  No hepatomegaly. ?Vascular: No leg edema. ? ? ?Lab Results: ? ?No results found for: WBC, HGB, HCT, MCV, PLT, NEUTROABS ? ?Imaging: ? ?No results found. ? ?Medications: I have reviewed the patient's current medications. ? ?Assessment/Plan: ?Common bile duct cholangiocarcinoma-stage IIb,pT2pN1 ?Presenting with obstructive jaundice summer 2019 ?Elevated preoperative CA 19-9 ?ERCP/EUS 09/02/2018-stenosis and lower third of the main bile duct, mass in the uncinate of the pancreas,-biopsy revealed minute focus of atypical cells ?Pancreaticoduodenectomy 11/15/2018,pT2pN1 moderately differentiated adenocarcinoma of the common bile duct, 2/35 lymph nodes, perineural invasion ?Patient reports 6 months of adjuvant gemcitabine/capecitabine ?Seen with complaint of abdominal pain 02/23/2022, CA 19-9 elevated, referred for CTs ?CTs 02/27/2022 status post Whipple without specific evidence of recurrent or metastatic disease.  Tiny pulmonary nodules not readily apparent on the outside CT of 07/27/2019.  No thoracic adenopathy.  Enlarged heterogeneous left lobe of the thyroid grossly similar to the prior exam. ?2.  Hypertension ?3.  Hyperlipidemia ?4.  COVID-19 2021 ? ?Disposition: Barbara Byrd has a history of cholangiocarcinoma dating to  2019.  She underwent a Whipple procedure December 2019 followed by 6 months of adjuvant chemotherapy.  She presented last week with complaint of abdominal pain.  CA 19-9 is elevated.  We referred her for CT scans.  Scan showed possible new tiny bilateral lung nodules.  No explanation for abdominal pain.  She will try an antacid.  She declines pain medication.  We will plan to see her back in approximately 3 weeks, refer to gastroenterology with persistent pain.  Repeat CA 19-9 at next visit.  She will contact the office with worsening abdominal pain, other problems. ? ?Patient seen with Dr. Benay Spice.  CT images reviewed on the computer with Barbara Byrd and her sister. ? ? ? ?Ned Card ANP/GNP-BC  ? ?03/02/2022  ?9:29 AM ?This was a shared visit with Ned Card.  Barbara Byrd was interviewed and examined.  She has abdominal discomfort and the CA 19-9 is elevated.  The restaging CT showed no clear evidence of disease progression.  The tiny lung nodules are most likely benign.  I reviewed the CT images with Barbara Byrd.  The plan is to continue observation and repeat the CA 19-9 in 3 weeks.  She will call for increased abdominal pain. ?We will consider a GI referral +/- a restaging PET if her symptoms persist. ? ?I was present for greater than 50% of today's visit.  I performed medical decision making. ? ?Julieanne Manson, MD ? ? ? ? ? ? ?

## 2022-03-03 ENCOUNTER — Inpatient Hospital Stay: Payer: Medicare HMO | Admitting: Oncology

## 2022-03-03 ENCOUNTER — Inpatient Hospital Stay: Payer: Medicare HMO

## 2022-03-23 ENCOUNTER — Other Ambulatory Visit: Payer: Self-pay | Admitting: Internal Medicine

## 2022-03-23 DIAGNOSIS — R7401 Elevation of levels of liver transaminase levels: Secondary | ICD-10-CM

## 2022-03-24 ENCOUNTER — Inpatient Hospital Stay: Payer: Medicare HMO | Attending: Oncology

## 2022-03-24 ENCOUNTER — Inpatient Hospital Stay: Payer: Medicare HMO | Admitting: Oncology

## 2022-03-24 VITALS — BP 125/68 | HR 73 | Temp 98.2°F | Resp 18 | Ht 64.0 in | Wt 267.0 lb

## 2022-03-24 DIAGNOSIS — Z8505 Personal history of malignant neoplasm of liver: Secondary | ICD-10-CM | POA: Diagnosis present

## 2022-03-24 DIAGNOSIS — E785 Hyperlipidemia, unspecified: Secondary | ICD-10-CM | POA: Diagnosis not present

## 2022-03-24 DIAGNOSIS — C221 Intrahepatic bile duct carcinoma: Secondary | ICD-10-CM

## 2022-03-24 DIAGNOSIS — I10 Essential (primary) hypertension: Secondary | ICD-10-CM | POA: Insufficient documentation

## 2022-03-24 DIAGNOSIS — R978 Other abnormal tumor markers: Secondary | ICD-10-CM | POA: Diagnosis not present

## 2022-03-24 DIAGNOSIS — R7401 Elevation of levels of liver transaminase levels: Secondary | ICD-10-CM | POA: Insufficient documentation

## 2022-03-24 LAB — CMP (CANCER CENTER ONLY)
ALT: 49 U/L — ABNORMAL HIGH (ref 0–44)
AST: 59 U/L — ABNORMAL HIGH (ref 15–41)
Albumin: 3.4 g/dL — ABNORMAL LOW (ref 3.5–5.0)
Alkaline Phosphatase: 510 U/L — ABNORMAL HIGH (ref 38–126)
Anion gap: 9 (ref 5–15)
BUN: 11 mg/dL (ref 8–23)
CO2: 27 mmol/L (ref 22–32)
Calcium: 10.1 mg/dL (ref 8.9–10.3)
Chloride: 102 mmol/L (ref 98–111)
Creatinine: 0.96 mg/dL (ref 0.44–1.00)
GFR, Estimated: >60 mL/min (ref >60)
Glucose, Bld: 151 mg/dL — ABNORMAL HIGH (ref 70–99)
Potassium: 3.8 mmol/L (ref 3.5–5.1)
Sodium: 138 mmol/L (ref 135–145)
Total Bilirubin: 1.2 mg/dL (ref 0.3–1.2)
Total Protein: 7.5 g/dL (ref 6.5–8.1)

## 2022-03-24 NOTE — Progress Notes (Signed)
?  [facility] ?OFFICE PROGRESS NOTE ? ? ?Diagnosis: Cholangiocarcinoma ? ?INTERVAL HISTORY:  ? ?Barbara Byrd returns as scheduled.  She reports partial improvement in abdominal discomfort.  Good appetite.  No difficulty with bowel function.  No bleeding.  ?Objective: ? ?Vital signs in last 24 hours: ? ?Blood pressure 125/68, pulse 73, temperature 98.2 ?F (36.8 ?C), temperature source Oral, resp. rate 18, height '5\' 4"'$  (1.626 m), weight 267 lb (121.1 kg), SpO2 98 %. ?  ? ?Lymphatics: No cervical, supraclavicular, axillary, or inguinal nodes ?Resp: Lungs clear bilaterally ?Cardio: Regular rate and rhythm ?GI: No hepatosplenomegaly, no apparent ascites, no mass ?Vascular: No leg edema ? ?Lab Results: ? ? ?CMP  ?Lab Results  ?Component Value Date  ? NA 138 02/27/2022  ? K 3.3 (L) 02/27/2022  ? CL 100 02/27/2022  ? CO2 28 02/27/2022  ? GLUCOSE 121 (H) 02/27/2022  ? BUN 12 02/27/2022  ? CREATININE 0.89 02/27/2022  ? CALCIUM 9.5 02/27/2022  ? GFRNONAA >60 02/27/2022  ? ? ?Lab Results  ?Component Value Date  ? [id number] (H) 02/23/2022  ? ?Medications: I have reviewed the patient's current medications. ? ? ?Assessment/Plan: ?Common bile duct cholangiocarcinoma-stage IIb,pT2pN1 ?Presenting with obstructive jaundice summer 2019 ?Elevated preoperative CA 19-9 ?ERCP/EUS 09/02/2018-stenosis and lower third of the main bile duct, mass in the uncinate of the pancreas,-biopsy revealed minute focus of atypical cells ?Pancreaticoduodenectomy 11/15/2018,pT2pN1 moderately differentiated adenocarcinoma of the common bile duct, 2/35 lymph nodes, perineural invasion ?Patient reports 6 months of adjuvant gemcitabine/capecitabine ?Seen with complaint of abdominal pain 02/23/2022, CA 19-9 elevated, referred for CTs ?CTs 02/27/2022 status post Whipple without specific evidence of recurrent or metastatic disease.  Tiny pulmonary nodules not readily apparent on the outside CT of 07/27/2019.  No thoracic adenopathy.  Enlarged  heterogeneous left lobe of the thyroid grossly similar to the prior exam. ?2.  Hypertension ?3.  Hyperlipidemia ?4.  COVID-19 2021 ? ? ?Disposition: ?Barbara Byrd has a history of cholangiocarcinoma.  The CA 19-9 was elevated last month.  We will follow-up on the CA 19-9 from today.  The liver enzymes are mildly elevated today.  Her clinical status has improved over the past month. ?She will return for an office and lab visit in 1 month.  The plan is to refer her for a staging PET scan if the tumor marker continues to rise. ? ?She will call in the interim for increased pain or new symptoms. ? ?[name], MD ? ?03/24/2022  ?9:35 AM ? ? ?

## 2022-03-25 LAB — CANCER ANTIGEN 19-9: CA 19-9: 700 U/mL — ABNORMAL HIGH (ref 0–35)

## 2022-04-08 ENCOUNTER — Ambulatory Visit: Payer: Medicare HMO | Admitting: Podiatry

## 2022-04-08 ENCOUNTER — Encounter: Payer: Self-pay | Admitting: Podiatry

## 2022-04-08 DIAGNOSIS — B351 Tinea unguium: Secondary | ICD-10-CM

## 2022-04-08 DIAGNOSIS — M79675 Pain in left toe(s): Secondary | ICD-10-CM

## 2022-04-08 DIAGNOSIS — M79674 Pain in right toe(s): Secondary | ICD-10-CM | POA: Diagnosis not present

## 2022-04-08 NOTE — Progress Notes (Signed)
This patient presents to the office for evaluation and treatment of long thick painful nails .  This patient is unable to trim her own nails since the patient cannot reach her feet.  Patient says the nails are painful walking and wearing his shoes.  She presents for preventive foot care services.  General Appearance  Alert, conversant and in no acute stress.  Vascular  Dorsalis pedis and posterior tibial  pulses are palpable  bilaterally.  Capillary return is within normal limits  bilaterally. Temperature is within normal limits  bilaterally.  Neurologic  Senn-Weinstein monofilament wire test within normal limits  bilaterally. Muscle power within normal limits bilaterally.  Nails Thick disfigured discolored nails with subungual debris  from hallux to fifth toes bilaterally. No evidence of bacterial infection or drainage bilaterally.  Orthopedic  No limitations of motion  feet .  No crepitus or effusions noted.  No bony pathology or digital deformities noted.  Skin  normotropic skin with no porokeratosis noted bilaterally.  No signs of infections or ulcers noted.     Onychomycosis  Pain in toes right foot  Pain in toes left foot  Debridement  of nails  1-5  B/L with a nail nipper.  Nails were then filed using a dremel tool with no incidents.    RTC  3 months    Eman Morimoto DPM   

## 2022-04-20 ENCOUNTER — Encounter: Payer: Self-pay | Admitting: Nurse Practitioner

## 2022-04-20 ENCOUNTER — Inpatient Hospital Stay: Payer: Medicare HMO | Attending: Oncology | Admitting: Nurse Practitioner

## 2022-04-20 ENCOUNTER — Inpatient Hospital Stay: Payer: Medicare HMO

## 2022-04-20 VITALS — BP 128/72 | HR 71 | Temp 98.2°F | Resp 18 | Ht 64.0 in | Wt 257.2 lb

## 2022-04-20 DIAGNOSIS — Z8505 Personal history of malignant neoplasm of liver: Secondary | ICD-10-CM | POA: Insufficient documentation

## 2022-04-20 DIAGNOSIS — E785 Hyperlipidemia, unspecified: Secondary | ICD-10-CM | POA: Diagnosis not present

## 2022-04-20 DIAGNOSIS — C221 Intrahepatic bile duct carcinoma: Secondary | ICD-10-CM

## 2022-04-20 DIAGNOSIS — R978 Other abnormal tumor markers: Secondary | ICD-10-CM | POA: Diagnosis not present

## 2022-04-20 DIAGNOSIS — Z9049 Acquired absence of other specified parts of digestive tract: Secondary | ICD-10-CM | POA: Diagnosis not present

## 2022-04-20 DIAGNOSIS — I10 Essential (primary) hypertension: Secondary | ICD-10-CM | POA: Insufficient documentation

## 2022-04-20 DIAGNOSIS — Z90411 Acquired partial absence of pancreas: Secondary | ICD-10-CM | POA: Insufficient documentation

## 2022-04-20 LAB — CMP (CANCER CENTER ONLY)
ALT: 38 U/L (ref 0–44)
AST: 56 U/L — ABNORMAL HIGH (ref 15–41)
Albumin: 3.3 g/dL — ABNORMAL LOW (ref 3.5–5.0)
Alkaline Phosphatase: 468 U/L — ABNORMAL HIGH (ref 38–126)
Anion gap: 11 (ref 5–15)
BUN: 15 mg/dL (ref 8–23)
CO2: 30 mmol/L (ref 22–32)
Calcium: 9.6 mg/dL (ref 8.9–10.3)
Chloride: 95 mmol/L — ABNORMAL LOW (ref 98–111)
Creatinine: 0.92 mg/dL (ref 0.44–1.00)
GFR, Estimated: >60 mL/min (ref >60)
Glucose, Bld: 136 mg/dL — ABNORMAL HIGH (ref 70–99)
Potassium: 3.2 mmol/L — ABNORMAL LOW (ref 3.5–5.1)
Sodium: 136 mmol/L (ref 135–145)
Total Bilirubin: 1.5 mg/dL — ABNORMAL HIGH (ref 0.3–1.2)
Total Protein: 7.9 g/dL (ref 6.5–8.1)

## 2022-04-20 LAB — CBC WITH DIFFERENTIAL (CANCER CENTER ONLY)
Abs Immature Granulocytes: 0.11 10*3/uL — ABNORMAL HIGH (ref 0.00–0.07)
Basophils Absolute: 0.1 10*3/uL (ref 0.0–0.1)
Basophils Relative: 1 %
Eosinophils Absolute: 0 10*3/uL (ref 0.0–0.5)
Eosinophils Relative: 0 %
HCT: 35.2 % — ABNORMAL LOW (ref 36.0–46.0)
Hemoglobin: 11.1 g/dL — ABNORMAL LOW (ref 12.0–15.0)
Immature Granulocytes: 1 %
Lymphocytes Relative: 12 %
Lymphs Abs: 1.4 10*3/uL (ref 0.7–4.0)
MCH: 28 pg (ref 26.0–34.0)
MCHC: 31.5 g/dL (ref 30.0–36.0)
MCV: 88.9 fL (ref 80.0–100.0)
Monocytes Absolute: 1 10*3/uL (ref 0.1–1.0)
Monocytes Relative: 9 %
Neutro Abs: 8.6 10*3/uL — ABNORMAL HIGH (ref 1.7–7.7)
Neutrophils Relative %: 77 %
Platelet Count: 373 10*3/uL (ref 150–400)
RBC: 3.96 MIL/uL (ref 3.87–5.11)
RDW: 14.2 % (ref 11.5–15.5)
WBC Count: 11.1 10*3/uL — ABNORMAL HIGH (ref 4.0–10.5)
nRBC: 0 % (ref 0.0–0.2)

## 2022-04-20 MED ORDER — POTASSIUM CHLORIDE ER 10 MEQ PO TBCR: 10.0000 meq | EXTENDED_RELEASE_TABLET | Freq: Every day | ORAL | 2 refills | Status: DC

## 2022-04-20 NOTE — Progress Notes (Signed)
?  Barbara Byrd ?OFFICE PROGRESS NOTE ? ? ?Diagnosis: Cholangiocarcinoma ? ?INTERVAL HISTORY:  ? ?Barbara Byrd returns as scheduled.  She reports urine has been orange for about the past month.  She has a periodic cough.  She has upper and lower abdominal pain.  No nausea or vomiting.  Bowels are moving.  Appetite is poor.  She estimates 10 pounds of weight loss since last office visit.  She reports occasional "fever".  She has not checked her temperature with a thermometer but feels feverish due to "sweating". ? ?Objective: ? ?Vital signs in last 24 hours: ? ?Blood pressure 128/72, pulse 71, temperature 98.2 ?F (36.8 ?C), temperature source Oral, resp. rate 18, height '5\' 4"'$  (1.626 m), weight 257 lb 3.2 oz (116.7 kg), SpO2 98 %. ?  ? ?HEENT: Sclera anicteric. ?Resp: Lungs clear bilaterally. ?Cardio: Regular rate and rhythm. ?GI: Rounded fullness superior to the umbilicus with associated tenderness.  No hepatomegaly.   ?Vascular: No leg edema. ? ? ?Lab Results: ? ?Lab Results  ?Component Value Date  ? WBC 11.1 (H) 04/20/2022  ? HGB 11.1 (L) 04/20/2022  ? HCT 35.2 (L) 04/20/2022  ? MCV 88.9 04/20/2022  ? PLT 373 04/20/2022  ? NEUTROABS 8.6 (H) 04/20/2022  ? ? ?Imaging: ? ?No results found. ? ?Medications: I have reviewed the patient's current medications. ? ?Assessment/Plan: ?Common bile duct cholangiocarcinoma-stage IIb,pT2pN1 ?Presenting with obstructive jaundice summer 2019 ?Elevated preoperative CA 19-9 ?ERCP/EUS 09/02/2018-stenosis and lower third of the main bile duct, mass in the uncinate of the pancreas,-biopsy revealed minute focus of atypical cells ?Pancreaticoduodenectomy 11/15/2018,pT2pN1 moderately differentiated adenocarcinoma of the common bile duct, 2/35 lymph nodes, perineural invasion ?Patient reports 6 months of adjuvant gemcitabine/capecitabine ?Seen with complaint of abdominal pain 02/23/2022, CA 19-9 elevated, referred for CTs ?CTs 02/27/2022 status post Whipple without specific evidence  of recurrent or metastatic disease.  Tiny pulmonary nodules not readily apparent on the outside CT of 07/27/2019.  No thoracic adenopathy. Enlarged heterogeneous left lobe of the thyroid grossly similar to the prior exam. ?2.  Hypertension ?3.  Hyperlipidemia ?4.  COVID-19 2021 ?  ? ?Disposition: Barbara Byrd has a history of bile duct cholangiocarcinoma status post pancreaticoduodenectomy December 2019.  She is experiencing anorexia/weight loss.  CA 19-9 was higher last office visit, today's value is pending.  She had CTs in March without evidence of metastatic disease.  We are referring her for a PET scan.  She will return for a follow-up visit in 2 weeks to review the results.  We are available to see her sooner if needed. ? ?Patient seen with Dr. Benay Byrd. ? ? ?Barbara Byrd  ? ?04/20/2022  ?9:14 AM ?This was a shared visit with Barbara Byrd.  Barbara Byrd was interviewed and examined.  We will follow-up on the CA 19-9 from today.  She is losing weight and has abdominal pain.  The CA 19-9 is elevated.  She will be referred for a staging PET scan. ?I was present for greater than 50% of today's visit.  I performed medical decision making. ? ?Barbara Manson, Barbara Byrd ? ? ? ? ? ?

## 2022-04-21 LAB — CANCER ANTIGEN 19-9: CA 19-9: 935 U/mL — ABNORMAL HIGH (ref 0–35)

## 2022-04-22 ENCOUNTER — Telehealth: Payer: Self-pay | Admitting: *Deleted

## 2022-04-22 NOTE — Telephone Encounter (Signed)
Called Barbara Byrd with PET scan appointment on 5/19 at [facility]. Arrive in admitting at 0730 for 0800 scan. NPO after midnight except for plain water till after test completed. NO gum or candy. OK to take am meds with sip of water. ?

## 2022-04-24 ENCOUNTER — Ambulatory Visit (HOSPITAL_COMMUNITY)
Admission: RE | Admit: 2022-04-24 | Discharge: 2022-04-24 | Disposition: A | Discharge status: Home / Self Care | Payer: Medicare HMO | Source: Ambulatory Visit | Attending: Nurse Practitioner | Admitting: Nurse Practitioner

## 2022-04-24 DIAGNOSIS — C221 Intrahepatic bile duct carcinoma: Secondary | ICD-10-CM | POA: Diagnosis present

## 2022-04-24 LAB — GLUCOSE, CAPILLARY: Glucose-Capillary: 156 mg/dL — ABNORMAL HIGH (ref 70–99)

## 2022-04-24 MED ORDER — FLUDEOXYGLUCOSE F - 18 (FDG) INJECTION
12.6000 | Freq: Once | INTRAVENOUS | Status: AC
  Administered 2022-04-24: 12.77 via INTRAVENOUS

## 2022-04-27 ENCOUNTER — Telehealth: Payer: Self-pay

## 2022-04-27 NOTE — Telephone Encounter (Signed)
Patient gave verbal understanding and she is schedule to come in on Friday.

## 2022-04-27 NOTE — Telephone Encounter (Signed)
-----   Message from Ladell Pier, MD sent at 04/27/2022  9:11 AM EDT ----- Please call patient, PET scan shows liver lesions suspicious for cancer, see if she would like to come in this Friday at 245, 30-minute visit

## 2022-05-01 ENCOUNTER — Encounter: Payer: Self-pay | Admitting: Oncology

## 2022-05-01 ENCOUNTER — Inpatient Hospital Stay: Payer: Medicare HMO | Admitting: Oncology

## 2022-05-01 VITALS — BP 128/70 | HR 70 | Temp 98.1°F | Resp 18 | Ht 64.0 in | Wt 255.0 lb

## 2022-05-01 DIAGNOSIS — C221 Intrahepatic bile duct carcinoma: Secondary | ICD-10-CM | POA: Diagnosis not present

## 2022-05-01 MED ORDER — TRAMADOL HCL 50 MG PO TABS: 50.0000 mg | ORAL_TABLET | Freq: Four times a day (QID) | ORAL | 0 refills | Status: DC | PRN

## 2022-05-01 NOTE — Progress Notes (Signed)
  [place] OFFICE PROGRESS NOTE   Diagnosis: Cholangiocarcinoma  INTERVAL HISTORY:   [name] returns as scheduled.  She continues to have abdominal discomfort.  The pain is now lower in the abdomen.  No other complaint.  She underwent a staging PET scan on 04/24/2022.  Objective:  Vital signs in last 24 hours:  Blood pressure 128/70, pulse 70, temperature 98.1 F (36.7 C), temperature source Oral, resp. rate 18, height '5\' 4"'$  (1.626 m), weight 255 lb (115.7 kg), SpO2 97 %.    Resp: Lungs clear bilaterally Cardio: Regular rate and rhythm GI: No mass, no hepatosplenomegaly Vascular: No leg edema  Skin: Moist rash at the lower abdominal skin fold  Lab Results:  Lab Results  Component Value Date   WBC 11.1 (H) 04/20/2022   HGB 11.1 (L) 04/20/2022   HCT 35.2 (L) 04/20/2022   MCV 88.9 04/20/2022   PLT 373 04/20/2022   NEUTROABS 8.6 (H) 04/20/2022    CMP  Lab Results  Component Value Date   NA 136 04/20/2022   K 3.2 (L) 04/20/2022   CL 95 (L) 04/20/2022   CO2 30 04/20/2022   GLUCOSE 136 (H) 04/20/2022   BUN 15 04/20/2022   CREATININE 0.92 04/20/2022   CALCIUM 9.6 04/20/2022   PROT 7.9 04/20/2022   ALBUMIN 3.3 (L) 04/20/2022   AST 56 (H) 04/20/2022   ALT 38 04/20/2022   ALKPHOS 468 (H) 04/20/2022   BILITOT 1.5 (H) 04/20/2022   GFRNONAA >60 04/20/2022    Lab Results  Component Value Date   IRJ188 935 (H) 04/20/2022    Medications: I have reviewed the patient's current medications.   Assessment/Plan: Common bile duct cholangiocarcinoma-stage IIb,pT2pN1 Presenting with obstructive jaundice summer 2019 Elevated preoperative CA 19-9 ERCP/EUS 09/02/2018-stenosis and lower third of the main bile duct, mass in the uncinate of the pancreas,-biopsy revealed minute focus of atypical cells Pancreaticoduodenectomy 11/15/2018,pT2pN1 moderately differentiated adenocarcinoma of the common bile duct, 2/35 lymph nodes, perineural invasion Patient reports 6  months of adjuvant gemcitabine/capecitabine Seen with complaint of abdominal pain 02/23/2022, CA 19-9 elevated, referred for CTs CTs 02/27/2022 status post Whipple without specific evidence of recurrent or metastatic disease.  Tiny pulmonary nodules not readily apparent on the outside CT of 07/27/2019.  No thoracic adenopathy. Enlarged heterogeneous left lobe of the thyroid grossly similar to the prior exam. PET 03/22/6062-[id number] hypermetabolic liver lesions consistent with metastases, mild uptake involving a right common iliac lymph node 2.  Hypertension 3.  Hyperlipidemia 4.  COVID-19 2021     Disposition: Barbara Byrd has a history cholangiocarcinoma.  The CA 19-9 has been rising over the past few months.  I reviewed the PET findings and images with [name].  The PET scan is consistent with metastatic disease involving liver.  She is 4 years out from diagnosis of the cholangiocarcinoma.  I will refer her for a diagnostic biopsy to confirm metastatic cholangiocarcinoma and obtain tissue for molecular testing.  I gave her prescription for tramadol to use as needed for pain.  She will return for an office visit after the liver biopsy.  [name], MD  05/01/2022  3:28 PM

## 2022-05-06 ENCOUNTER — Encounter: Payer: Self-pay | Admitting: *Deleted

## 2022-05-06 NOTE — Progress Notes (Signed)
Call to central scheduling to f/u on status of US liver biospy ordered on 05/01/22. Was in formed that "it is in review".

## 2022-05-08 ENCOUNTER — Inpatient Hospital Stay: Payer: Medicare HMO | Admitting: Oncology

## 2022-05-11 ENCOUNTER — Other Ambulatory Visit: Payer: Self-pay | Admitting: *Deleted

## 2022-05-11 DIAGNOSIS — C221 Intrahepatic bile duct carcinoma: Secondary | ICD-10-CM

## 2022-05-11 NOTE — Progress Notes (Signed)
MRI Abdomen with and without orders entered per Dr Benay Spice

## 2022-05-14 ENCOUNTER — Encounter: Payer: Self-pay | Admitting: Nurse Practitioner

## 2022-05-14 ENCOUNTER — Inpatient Hospital Stay: Payer: Medicare HMO | Attending: Oncology | Admitting: Nurse Practitioner

## 2022-05-14 ENCOUNTER — Encounter: Payer: Self-pay | Admitting: *Deleted

## 2022-05-14 VITALS — BP 109/53 | HR 73 | Temp 98.2°F | Resp 18 | Wt 246.0 lb

## 2022-05-14 DIAGNOSIS — I10 Essential (primary) hypertension: Secondary | ICD-10-CM | POA: Diagnosis not present

## 2022-05-14 DIAGNOSIS — C240 Malignant neoplasm of extrahepatic bile duct: Secondary | ICD-10-CM | POA: Diagnosis present

## 2022-05-14 DIAGNOSIS — C221 Intrahepatic bile duct carcinoma: Secondary | ICD-10-CM | POA: Diagnosis not present

## 2022-05-14 DIAGNOSIS — E785 Hyperlipidemia, unspecified: Secondary | ICD-10-CM | POA: Diagnosis not present

## 2022-05-14 DIAGNOSIS — Z90411 Acquired partial absence of pancreas: Secondary | ICD-10-CM | POA: Diagnosis not present

## 2022-05-14 DIAGNOSIS — Z9049 Acquired absence of other specified parts of digestive tract: Secondary | ICD-10-CM | POA: Insufficient documentation

## 2022-05-14 MED ORDER — TRAMADOL HCL 50 MG PO TABS: 50.0000 mg | ORAL_TABLET | Freq: Four times a day (QID) | ORAL | 0 refills | Status: DC | PRN

## 2022-05-14 NOTE — Progress Notes (Signed)
Patient notified of MRI scheduled for 05/17/22 at [facility] with 0730 arrival for 0800 scan. NPO 4 hours prior. Managed care notified.

## 2022-05-14 NOTE — Progress Notes (Signed)
  [place] OFFICE PROGRESS NOTE   Diagnosis: Cholangiocarcinoma  INTERVAL HISTORY:   Ms. Kobler returns as scheduled.  Abdominal pain is "about the same".  Appetite remains poor.  She is losing weight.  Objective:  Vital signs in last 24 hours:  Blood pressure (!) 109/53, pulse 73, temperature 98.2 F (36.8 C), temperature source Tympanic, resp. rate 18, weight 246 lb (111.6 kg), SpO2 99 %.    Resp: Lungs clear bilaterally. Cardio: Regular rate and rhythm. GI: No hepatosplenomegaly.  No mass. Vascular: No leg edema.  Lab Results:  Lab Results  Component Value Date   WBC 11.1 (H) 04/20/2022   HGB 11.1 (L) 04/20/2022   HCT 35.2 (L) 04/20/2022   MCV 88.9 04/20/2022   PLT 373 04/20/2022   NEUTROABS 8.6 (H) 04/20/2022    Imaging:  No results found.  Medications: I have reviewed the patient's current medications.  Assessment/Plan: Common bile duct cholangiocarcinoma-stage IIb,pT2pN1 Presenting with obstructive jaundice summer 2019 Elevated preoperative CA 19-9 ERCP/EUS 09/02/2018-stenosis and lower third of the main bile duct, mass in the uncinate of the pancreas,-biopsy revealed minute focus of atypical cells Pancreaticoduodenectomy 11/15/2018,pT2pN1 moderately differentiated adenocarcinoma of the common bile duct, 2/35 lymph nodes, perineural invasion Patient reports 6 months of adjuvant gemcitabine/capecitabine Seen with complaint of abdominal pain 02/23/2022, CA 19-9 elevated, referred for CTs CTs 02/27/2022 status post Whipple without specific evidence of recurrent or metastatic disease.  Tiny pulmonary nodules not readily apparent on the outside CT of 07/27/2019.  No thoracic adenopathy. Enlarged heterogeneous left lobe of the thyroid grossly similar to the prior exam. PET 4/58/0998-[id number] hypermetabolic liver lesions consistent with metastases, mild uptake involving a right common iliac lymph node 2.  Hypertension 3.  Hyperlipidemia 4.  COVID-19  2021    Disposition: Ms. Abid appears unchanged.  She was referred for biopsy of a liver lesion following the last office visit.  MRI is needed prior to the biopsy.  She is scheduled for an abdominal MRI 05/17/2022.  We will try to get the biopsy scheduled for later next week.  She understands the purpose of the biopsy is to confirm metastatic cholangiocarcinoma and obtain tissue for molecular testing.  She will continue tramadol as needed for pain.  New prescription sent to her pharmacy today.  Referral made to the [facility] dietitian.  She will begin nutritional supplements.  Follow-up appointment in 3 weeks.    Ned Card ANP/GNP-BC   05/14/2022  9:26 AM

## 2022-05-14 NOTE — Progress Notes (Signed)
Provided patient samples and coupons for Ensure and dietician referral ordered.

## 2022-05-17 ENCOUNTER — Ambulatory Visit (HOSPITAL_COMMUNITY)
Admission: RE | Admit: 2022-05-17 | Discharge: 2022-05-17 | Disposition: A | Discharge status: Home / Self Care | Payer: Medicare HMO | Source: Ambulatory Visit | Attending: Oncology | Admitting: Oncology

## 2022-05-17 DIAGNOSIS — C221 Intrahepatic bile duct carcinoma: Secondary | ICD-10-CM | POA: Diagnosis present

## 2022-05-17 MED ORDER — GADOBUTROL 1 MMOL/ML IV SOLN
10.0000 mL | Freq: Once | INTRAVENOUS | Status: AC | PRN
  Administered 2022-05-17: 10 mL via INTRAVENOUS

## 2022-05-19 ENCOUNTER — Other Ambulatory Visit: Payer: Self-pay | Admitting: *Deleted

## 2022-05-19 DIAGNOSIS — C221 Intrahepatic bile duct carcinoma: Secondary | ICD-10-CM

## 2022-05-19 NOTE — Progress Notes (Signed)
Referral placed to GI for ERCP with biopsy

## 2022-05-20 NOTE — Progress Notes (Unsigned)
Barbara Edouard, MD  [name]   [name]

## 2022-05-21 ENCOUNTER — Encounter: Payer: Self-pay | Admitting: Gastroenterology

## 2022-05-22 ENCOUNTER — Encounter: Payer: Self-pay | Admitting: Oncology

## 2022-05-25 ENCOUNTER — Encounter: Payer: Self-pay | Admitting: *Deleted

## 2022-05-25 NOTE — Progress Notes (Signed)
Patient obtained her medical records from [place] and emailed them to [facility] nurse. Forwared email to HIM w/request to have scanned into [employer] tab.

## 2022-05-27 ENCOUNTER — Other Ambulatory Visit: Payer: Self-pay | Admitting: *Deleted

## 2022-05-27 ENCOUNTER — Encounter: Payer: Self-pay | Admitting: *Deleted

## 2022-05-27 ENCOUNTER — Other Ambulatory Visit: Payer: Self-pay

## 2022-05-27 DIAGNOSIS — C221 Intrahepatic bile duct carcinoma: Secondary | ICD-10-CM

## 2022-05-27 NOTE — Progress Notes (Signed)
PATIENT NAVIGATOR PROGRESS NOTE  Name: Barbara Byrd Date: 05/27/2022 MRN: [id number]  DOB: 1941-04-08   Reason for visit:  Telephone call to discuss change of appointments  Comments:  Called and left message for patient regarding change of appointments. Cancel appt with Dr Candis Schatz this Friday and she will see Dr Benay Spice with labs this Friday at 10:00 to discuss GI conference recommendations    Time spent counseling/coordinating care: 30-45 minutes

## 2022-05-27 NOTE — Progress Notes (Signed)
The proposed treatment discussed in conference is for discussion purpose only and is not a binding recommendation.  The patients have not been physically examined, or presented with their treatment options.  Therefore, final treatment plans cannot be decided.  

## 2022-05-29 ENCOUNTER — Inpatient Hospital Stay: Payer: Medicare HMO

## 2022-05-29 ENCOUNTER — Ambulatory Visit: Payer: Medicare HMO | Admitting: Gastroenterology

## 2022-05-29 ENCOUNTER — Other Ambulatory Visit (HOSPITAL_COMMUNITY): Payer: Self-pay | Admitting: Oncology

## 2022-05-29 ENCOUNTER — Inpatient Hospital Stay: Payer: Medicare HMO | Admitting: Oncology

## 2022-05-29 ENCOUNTER — Telehealth: Payer: Self-pay

## 2022-05-29 VITALS — BP 128/73 | HR 77 | Temp 97.7°F | Resp 18 | Ht 64.0 in | Wt 243.0 lb

## 2022-05-29 DIAGNOSIS — C240 Malignant neoplasm of extrahepatic bile duct: Secondary | ICD-10-CM | POA: Diagnosis not present

## 2022-05-29 DIAGNOSIS — C221 Intrahepatic bile duct carcinoma: Secondary | ICD-10-CM

## 2022-05-29 LAB — CBC WITH DIFFERENTIAL (CANCER CENTER ONLY)
Abs Immature Granulocytes: 0.4 10*3/uL — ABNORMAL HIGH (ref 0.00–0.07)
Basophils Absolute: 0.1 10*3/uL (ref 0.0–0.1)
Basophils Relative: 0 %
Eosinophils Absolute: 0.1 10*3/uL (ref 0.0–0.5)
Eosinophils Relative: 0 %
HCT: 33.5 % — ABNORMAL LOW (ref 36.0–46.0)
Hemoglobin: 11.2 g/dL — ABNORMAL LOW (ref 12.0–15.0)
Immature Granulocytes: 2 %
Lymphocytes Relative: 8 %
Lymphs Abs: 1.4 10*3/uL (ref 0.7–4.0)
MCH: 27.8 pg (ref 26.0–34.0)
MCHC: 33.4 g/dL (ref 30.0–36.0)
MCV: 83.1 fL (ref 80.0–100.0)
Monocytes Absolute: 1.6 10*3/uL — ABNORMAL HIGH (ref 0.1–1.0)
Monocytes Relative: 9 %
Neutro Abs: 13.6 10*3/uL — ABNORMAL HIGH (ref 1.7–7.7)
Neutrophils Relative %: 81 %
Platelet Count: 509 10*3/uL — ABNORMAL HIGH (ref 150–400)
RBC: 4.03 MIL/uL (ref 3.87–5.11)
RDW: 15.3 % (ref 11.5–15.5)
WBC Count: 17.2 10*3/uL — ABNORMAL HIGH (ref 4.0–10.5)
nRBC: 0 % (ref 0.0–0.2)

## 2022-05-29 LAB — CMP (CANCER CENTER ONLY)
ALT: 55 U/L — ABNORMAL HIGH (ref 0–44)
AST: 72 U/L — ABNORMAL HIGH (ref 15–41)
Albumin: 3 g/dL — ABNORMAL LOW (ref 3.5–5.0)
Alkaline Phosphatase: 602 U/L — ABNORMAL HIGH (ref 38–126)
Anion gap: 11 (ref 5–15)
BUN: 12 mg/dL (ref 8–23)
CO2: 27 mmol/L (ref 22–32)
Calcium: 9.9 mg/dL (ref 8.9–10.3)
Chloride: 93 mmol/L — ABNORMAL LOW (ref 98–111)
Creatinine: 0.94 mg/dL (ref 0.44–1.00)
GFR, Estimated: >60 mL/min (ref >60)
Glucose, Bld: 196 mg/dL — ABNORMAL HIGH (ref 70–99)
Potassium: 4.1 mmol/L (ref 3.5–5.1)
Sodium: 131 mmol/L — ABNORMAL LOW (ref 135–145)
Total Bilirubin: 4.3 mg/dL (ref 0.3–1.2)
Total Protein: 7.8 g/dL (ref 6.5–8.1)

## 2022-06-01 ENCOUNTER — Other Ambulatory Visit: Payer: Self-pay | Admitting: Physician Assistant

## 2022-06-01 DIAGNOSIS — C221 Intrahepatic bile duct carcinoma: Secondary | ICD-10-CM

## 2022-06-01 MED ORDER — CEFOXITIN SODIUM 2 G IV SOLR: 2.0000 g | Freq: Once | INTRAVENOUS | Status: DC

## 2022-06-02 ENCOUNTER — Encounter (HOSPITAL_COMMUNITY): Payer: Self-pay

## 2022-06-02 ENCOUNTER — Inpatient Hospital Stay (HOSPITAL_COMMUNITY)
Admission: RE | Admit: 2022-06-02 | Discharge: 2022-06-05 | DRG: 444 | Disposition: A | Discharge status: Home / Self Care | Payer: Medicare HMO | Source: Ambulatory Visit | Attending: Internal Medicine | Admitting: Internal Medicine

## 2022-06-02 ENCOUNTER — Other Ambulatory Visit (HOSPITAL_COMMUNITY): Payer: Self-pay | Admitting: Oncology

## 2022-06-02 ENCOUNTER — Other Ambulatory Visit: Payer: Self-pay

## 2022-06-02 DIAGNOSIS — I471 Supraventricular tachycardia, unspecified: Secondary | ICD-10-CM

## 2022-06-02 DIAGNOSIS — Z6841 Body Mass Index (BMI) 40.0 and over, adult: Secondary | ICD-10-CM

## 2022-06-02 DIAGNOSIS — R7881 Bacteremia: Secondary | ICD-10-CM | POA: Diagnosis present

## 2022-06-02 DIAGNOSIS — K8309 Other cholangitis: Secondary | ICD-10-CM | POA: Diagnosis present

## 2022-06-02 DIAGNOSIS — C221 Intrahepatic bile duct carcinoma: Secondary | ICD-10-CM | POA: Diagnosis present

## 2022-06-02 DIAGNOSIS — Z8042 Family history of malignant neoplasm of prostate: Secondary | ICD-10-CM | POA: Diagnosis not present

## 2022-06-02 DIAGNOSIS — Z8505 Personal history of malignant neoplasm of liver: Secondary | ICD-10-CM | POA: Diagnosis not present

## 2022-06-02 DIAGNOSIS — A4151 Sepsis due to Escherichia coli [E. coli]: Secondary | ICD-10-CM | POA: Diagnosis not present

## 2022-06-02 DIAGNOSIS — Z823 Family history of stroke: Secondary | ICD-10-CM

## 2022-06-02 DIAGNOSIS — Z8249 Family history of ischemic heart disease and other diseases of the circulatory system: Secondary | ICD-10-CM | POA: Diagnosis not present

## 2022-06-02 DIAGNOSIS — T8144XA Sepsis following a procedure, initial encounter: Secondary | ICD-10-CM | POA: Diagnosis not present

## 2022-06-02 DIAGNOSIS — E785 Hyperlipidemia, unspecified: Secondary | ICD-10-CM | POA: Diagnosis present

## 2022-06-02 DIAGNOSIS — K831 Obstruction of bile duct: Principal | ICD-10-CM | POA: Diagnosis present

## 2022-06-02 DIAGNOSIS — I10 Essential (primary) hypertension: Secondary | ICD-10-CM | POA: Diagnosis present

## 2022-06-02 DIAGNOSIS — Z90411 Acquired partial absence of pancreas: Secondary | ICD-10-CM | POA: Diagnosis not present

## 2022-06-02 DIAGNOSIS — A419 Sepsis, unspecified organism: Secondary | ICD-10-CM | POA: Diagnosis not present

## 2022-06-02 DIAGNOSIS — T8112XA Postprocedural septic shock, initial encounter: Secondary | ICD-10-CM | POA: Diagnosis not present

## 2022-06-02 DIAGNOSIS — R6521 Severe sepsis with septic shock: Secondary | ICD-10-CM | POA: Diagnosis not present

## 2022-06-02 DIAGNOSIS — Z808 Family history of malignant neoplasm of other organs or systems: Secondary | ICD-10-CM | POA: Diagnosis not present

## 2022-06-02 DIAGNOSIS — B9620 Unspecified Escherichia coli [E. coli] as the cause of diseases classified elsewhere: Secondary | ICD-10-CM | POA: Diagnosis not present

## 2022-06-02 DIAGNOSIS — Z79899 Other long term (current) drug therapy: Secondary | ICD-10-CM

## 2022-06-02 HISTORY — PX: IR INT EXT BILIARY DRAIN WITH CHOLANGIOGRAM: IMG6044

## 2022-06-02 LAB — COMPREHENSIVE METABOLIC PANEL WITH GFR
ALT: 62 U/L — ABNORMAL HIGH (ref 0–44)
AST: 102 U/L — ABNORMAL HIGH (ref 15–41)
Albumin: 2 g/dL — ABNORMAL LOW (ref 3.5–5.0)
Alkaline Phosphatase: 592 U/L — ABNORMAL HIGH (ref 38–126)
Anion gap: 14 (ref 5–15)
BUN: 11 mg/dL (ref 8–23)
CO2: 27 mmol/L (ref 22–32)
Calcium: 9.2 mg/dL (ref 8.9–10.3)
Chloride: 93 mmol/L — ABNORMAL LOW (ref 98–111)
Creatinine, Ser: 1.05 mg/dL — ABNORMAL HIGH (ref 0.44–1.00)
GFR, Estimated: 54 mL/min — ABNORMAL LOW
Glucose, Bld: 185 mg/dL — ABNORMAL HIGH (ref 70–99)
Potassium: 4 mmol/L (ref 3.5–5.1)
Sodium: 134 mmol/L — ABNORMAL LOW (ref 135–145)
Total Bilirubin: 6.3 mg/dL — ABNORMAL HIGH (ref 0.3–1.2)
Total Protein: 7.7 g/dL (ref 6.5–8.1)

## 2022-06-02 LAB — CBC
HCT: 35.3 % — ABNORMAL LOW (ref 36.0–46.0)
Hemoglobin: 11.6 g/dL — ABNORMAL LOW (ref 12.0–15.0)
MCH: 28.2 pg (ref 26.0–34.0)
MCHC: 32.9 g/dL (ref 30.0–36.0)
MCV: 85.7 fL (ref 80.0–100.0)
Platelets: 454 10*3/uL — ABNORMAL HIGH (ref 150–400)
RBC: 4.12 MIL/uL (ref 3.87–5.11)
RDW: 16.1 % — ABNORMAL HIGH (ref 11.5–15.5)
WBC: 20.3 10*3/uL — ABNORMAL HIGH (ref 4.0–10.5)
nRBC: 0 % (ref 0.0–0.2)

## 2022-06-02 LAB — PROTIME-INR
INR: 1.3 — ABNORMAL HIGH (ref 0.8–1.2)
Prothrombin Time: 15.6 seconds — ABNORMAL HIGH (ref 11.4–15.2)

## 2022-06-02 LAB — LACTIC ACID, PLASMA: Lactic Acid, Venous: 4.3 mmol/L (ref 0.5–1.9)

## 2022-06-02 MED ORDER — HYDROCODONE-ACETAMINOPHEN 5-325 MG PO TABS: 1.0000 | ORAL_TABLET | ORAL | Status: DC | PRN

## 2022-06-02 MED ORDER — FENTANYL CITRATE PF 50 MCG/ML IJ SOSY: 12.5000 ug | PREFILLED_SYRINGE | INTRAMUSCULAR | Status: DC | PRN

## 2022-06-02 MED ORDER — LIDOCAINE HCL 1 % IJ SOLN
INTRAMUSCULAR | Status: AC
  Filled 2022-06-02: qty 20

## 2022-06-02 MED ORDER — SENNOSIDES-DOCUSATE SODIUM 8.6-50 MG PO TABS
1.0000 | ORAL_TABLET | Freq: Every evening | ORAL | Status: DC | PRN
  Administered 2022-06-04: 1 via ORAL
  Filled 2022-06-02: qty 1

## 2022-06-02 MED ORDER — SODIUM CHLORIDE 0.9 % IV SOLN
2.0000 g | Freq: Three times a day (TID) | INTRAVENOUS | Status: DC
  Administered 2022-06-03: 2 g via INTRAVENOUS
  Filled 2022-06-02: qty 12.5

## 2022-06-02 MED ORDER — FENTANYL CITRATE (PF) 100 MCG/2ML IJ SOLN
INTRAMUSCULAR | Status: AC
  Filled 2022-06-02: qty 2

## 2022-06-02 MED ORDER — SODIUM CHLORIDE 0.9% FLUSH
5.0000 mL | Freq: Three times a day (TID) | INTRAVENOUS | Status: DC
  Administered 2022-06-02 – 2022-06-05 (×7): 5 mL

## 2022-06-02 MED ORDER — METRONIDAZOLE 500 MG/100ML IV SOLN
500.0000 mg | Freq: Two times a day (BID) | INTRAVENOUS | Status: DC
  Administered 2022-06-02 – 2022-06-03 (×2): 500 mg via INTRAVENOUS
  Filled 2022-06-02 (×2): qty 100

## 2022-06-02 MED ORDER — MIDAZOLAM HCL 2 MG/2ML IJ SOLN
INTRAMUSCULAR | Status: AC
  Filled 2022-06-02: qty 2

## 2022-06-02 MED ORDER — SODIUM CHLORIDE 0.9 % IV SOLN
2.0000 g | Freq: Once | INTRAVENOUS | Status: AC
  Administered 2022-06-02: 2 g via INTRAVENOUS
  Filled 2022-06-02: qty 12.5

## 2022-06-02 MED ORDER — MEPERIDINE HCL 25 MG/ML IJ SOLN
INTRAMUSCULAR | Status: AC | PRN
  Administered 2022-06-02 (×2): 12.5 mg via INTRAVENOUS

## 2022-06-02 MED ORDER — MIDAZOLAM HCL 2 MG/2ML IJ SOLN
INTRAMUSCULAR | Status: AC | PRN
  Administered 2022-06-02: .5 mg via INTRAVENOUS
  Administered 2022-06-02: 1 mg via INTRAVENOUS
  Administered 2022-06-02 (×5): .5 mg via INTRAVENOUS

## 2022-06-02 MED ORDER — SODIUM CHLORIDE 0.9 % IV BOLUS
INTRAVENOUS | Status: AC | PRN
  Administered 2022-06-02: 500 mL via INTRAVENOUS

## 2022-06-02 MED ORDER — ONDANSETRON HCL 4 MG/2ML IJ SOLN
INTRAMUSCULAR | Status: AC | PRN
  Administered 2022-06-02: 4 mg via INTRAVENOUS

## 2022-06-02 MED ORDER — FENTANYL CITRATE (PF) 100 MCG/2ML IJ SOLN
INTRAMUSCULAR | Status: AC | PRN
  Administered 2022-06-02: 25 ug via INTRAVENOUS
  Administered 2022-06-02: 50 ug via INTRAVENOUS
  Administered 2022-06-02 (×3): 25 ug via INTRAVENOUS

## 2022-06-02 MED ORDER — SODIUM CHLORIDE 0.9% FLUSH
3.0000 mL | Freq: Two times a day (BID) | INTRAVENOUS | Status: DC
Start: 2022-06-02 — End: 2022-06-05
  Administered 2022-06-02 – 2022-06-05 (×4): 3 mL via INTRAVENOUS

## 2022-06-02 MED ORDER — ONDANSETRON HCL 4 MG/2ML IJ SOLN
INTRAMUSCULAR | Status: AC
  Filled 2022-06-02: qty 2

## 2022-06-02 MED ORDER — ONDANSETRON HCL 4 MG/2ML IJ SOLN
4.0000 mg | Freq: Four times a day (QID) | INTRAMUSCULAR | Status: DC | PRN
  Administered 2022-06-05: 4 mg via INTRAVENOUS
  Filled 2022-06-02: qty 2

## 2022-06-02 MED ORDER — MEPERIDINE HCL 25 MG/ML IJ SOLN
INTRAMUSCULAR | Status: AC
  Filled 2022-06-02: qty 1

## 2022-06-02 MED ORDER — SODIUM CHLORIDE 0.9 % IV SOLN
2.0000 g | INTRAVENOUS | Status: AC
  Administered 2022-06-02: 2 g via INTRAVENOUS
  Filled 2022-06-02: qty 2

## 2022-06-02 MED ORDER — LACTATED RINGERS IV BOLUS (SEPSIS)
750.0000 mL | Freq: Once | INTRAVENOUS | Status: AC
  Administered 2022-06-02: 750 mL via INTRAVENOUS

## 2022-06-02 MED ORDER — LACTATED RINGERS IV BOLUS (SEPSIS)
2000.0000 mL | Freq: Once | INTRAVENOUS | Status: AC
  Administered 2022-06-02: 2000 mL via INTRAVENOUS

## 2022-06-02 MED ORDER — OXYCODONE HCL 5 MG PO TABS
5.0000 mg | ORAL_TABLET | ORAL | Status: DC | PRN
  Administered 2022-06-03 – 2022-06-04 (×2): 5 mg via ORAL
  Filled 2022-06-02 (×2): qty 1

## 2022-06-02 MED ORDER — IOHEXOL 300 MG/ML  SOLN
100.0000 mL | Freq: Once | INTRAMUSCULAR | Status: AC | PRN
  Administered 2022-06-02: 50 mL

## 2022-06-02 MED ORDER — SODIUM CHLORIDE 0.9 % IV SOLN: 2.0000 g | Freq: Three times a day (TID) | INTRAVENOUS | Status: DC

## 2022-06-02 MED ORDER — IBUPROFEN 400 MG PO TABS
400.0000 mg | ORAL_TABLET | Freq: Four times a day (QID) | ORAL | Status: DC | PRN
Start: 2022-06-02 — End: 2022-06-05

## 2022-06-02 MED ORDER — ONDANSETRON HCL 4 MG PO TABS: 4.0000 mg | ORAL_TABLET | Freq: Four times a day (QID) | ORAL | Status: DC | PRN

## 2022-06-02 MED ORDER — ENOXAPARIN SODIUM 40 MG/0.4ML IJ SOSY
40.0000 mg | PREFILLED_SYRINGE | INTRAMUSCULAR | Status: DC
  Administered 2022-06-02 – 2022-06-03 (×2): 40 mg via SUBCUTANEOUS
  Filled 2022-06-02 (×2): qty 0.4

## 2022-06-02 MED ORDER — SODIUM CHLORIDE 0.9 % IV SOLN: INTRAVENOUS | Status: DC

## 2022-06-02 NOTE — Sedation Documentation (Signed)
Dr. Lowella Dandy back in unit

## 2022-06-02 NOTE — Sedation Documentation (Signed)
Dr. Lowella Dandy at bedside assessing pt. Pt did vomit and went bradycardic into the 40s. Current heart rate 69. Pt answering questions and says she is doing better.

## 2022-06-02 NOTE — Sedation Documentation (Addendum)
PT transported to 5W01. PT transported by this RN and another Charity fundraiser. PT alert and answering questions in room. All of [place], RN questions answered prior to this RN's departure.

## 2022-06-02 NOTE — H&P (Signed)
History and Physical    [place] ZOX:[id number] DOB: 1941/06/17 DOA: 06/02/2022  PCP: Penelope Galas, MD   Patient coming from: Home   Chief Complaint: lower abdominal pain, loss of appetite, concern for mets and biliary obstruction on recent imaging   HPI: [place] is a pleasant 81 y.o. female with medical history significant for hypertension, hyperlipidemia, and cholangiocarcinoma status post Whipple procedure and adjuvant chemotherapy, sent to the hospital today for percutaneous transhepatic cholangiogram and placement of biliary stent/drain by IR after recent PET was concerning for mets and MRI concerning for biliary obstruction.  Patient has had a loss of appetite, nausea, and chronic abdominal pain.  She underwent the percutaneous transhepatic cholangiogram and placement of biliary drain with IR this afternoon, shortly after developed rigors with bradycardia, hypotension, nausea, and vomiting.  When seen on the progressive unit, she reports feeling somewhat better after IV fluids and denies chest pain, lightheadedness, or nausea at this time.  She has some pain around the drain site.  Review of Systems:  All other systems reviewed and apart from HPI, are negative.  Past Medical History:  Diagnosis Date   Family history of cancer of pituitary gland and craniopharyngeal duct    Family history of prostate cancer     Past Surgical History:  Procedure Laterality Date   IR REMOVAL TUN ACCESS W/ PORT W/O FL MOD SED  11/14/2021    Social History:   reports that she has never smoked. She does not have any smokeless tobacco history on file. She reports current alcohol use. She reports that she does not use drugs.  No Known Allergies  Family History  Problem Relation Age of Onset   Heart disease Mother 72   Cerebral aneurysm Father 20   Brain cancer Brother 55       pituitary tumor   Stroke Maternal Grandmother    Brain cancer Nephew 38       pituitary gland    Prostate cancer Nephew 45     Prior to Admission medications   Medication Sig Start Date End Date Taking? Authorizing Provider  amLODipine (NORVASC) 10 MG tablet Take 10 mg by mouth daily. 04/15/21  Yes [provider]  bisoprolol-hydrochlorothiazide (ZIAC) 5-6.25 MG tablet Take 1 tablet by mouth daily. 04/15/21  Yes [provider]  potassium chloride (KLOR-CON) 10 MEQ tablet Take 1 tablet (10 mEq total) by mouth daily. 04/20/22  Yes Rana Snare, NP  rosuvastatin (CRESTOR) 5 MG tablet Take 5 mg by mouth daily. 04/15/21  Yes [provider]  traMADol (ULTRAM) 50 MG tablet Take 1 tablet (50 mg total) by mouth every 6 (six) hours as needed for moderate pain. 05/14/22  Yes Rana Snare, NP    Physical Exam: Vitals:   06/02/22 1715 06/02/22 1745 06/02/22 1755 06/02/22 2000  BP: 113/77 (!) 84/51 (!) 95/48 (!) 77/56  Pulse: (!) 110 92 (!) 108 (!) 101  Resp: 14 (!) 22 (!) 24 (!) 21  Temp:    98.9 F (37.2 C)  TempSrc:    Oral  SpO2: 100% 100% 99% 97%  Weight:      Height:        Constitutional: NAD, no diaphoresis   Eyes: PERTLA, lids and conjunctivae normal ENMT: Mucous membranes are moist. Posterior pharynx clear of any exudate or lesions.   Neck: supple, no masses  Respiratory: no wheezing, no crackles. No accessory muscle use.  Cardiovascular: S1 & S2 heard, regular rate and rhythm. No extremity  edema.   Abdomen: Soft, no rebound pain or guarding. Bowel sounds active.  Musculoskeletal: no clubbing / cyanosis. No joint deformity upper and lower extremities.   Skin: no significant rashes, lesions, ulcers. Warm, dry, well-perfused. Neurologic: CN 2-12 grossly intact. Moving all extremities. Alert and oriented to person, place, and situation.  Psychiatric: Pleasant. Cooperative.    Labs and Imaging on Admission: I have personally reviewed following labs and imaging studies  CBC: Recent Labs  Lab 05/29/22 0931 06/02/22 1145  WBC 17.2* 20.3*  NEUTROABS  13.6*  --   HGB 11.2* 11.6*  HCT 33.5* 35.3*  MCV 83.1 85.7  PLT 509* 454*   Basic Metabolic Panel: Recent Labs  Lab 05/29/22 0931 06/02/22 1145  NA 131* 134*  K 4.1 4.0  CL 93* 93*  CO2 27 27  GLUCOSE 196* 185*  BUN 12 11  CREATININE 0.94 1.05*  CALCIUM 9.9 9.2   GFR: Estimated Creatinine Clearance: 56.4 mL/min (A) (by C-G formula based on SCr of 1.05 mg/dL (H)). Liver Function Tests: Recent Labs  Lab 05/29/22 0931 06/02/22 1145  AST 72* 102*  ALT 55* 62*  ALKPHOS 602* 592*  BILITOT 4.3* 6.3*  PROT 7.8 7.7  ALBUMIN 3.0* 2.0*   No results for input(s): "LIPASE", "AMYLASE" in the last 168 hours. No results for input(s): "AMMONIA" in the last 168 hours. Coagulation Profile: Recent Labs  Lab 06/02/22 1218  INR 1.3*   Cardiac Enzymes: No results for input(s): "CKTOTAL", "CKMB", "CKMBINDEX", "TROPONINI" in the last 168 hours. BNP (last 3 results) No results for input(s): "PROBNP" in the last 8760 hours. HbA1C: No results for input(s): "HGBA1C" in the last 72 hours. CBG: No results for input(s): "GLUCAP" in the last 168 hours. Lipid Profile: No results for input(s): "CHOL", "HDL", "LDLCALC", "TRIG", "CHOLHDL", "LDLDIRECT" in the last 72 hours. Thyroid Function Tests: No results for input(s): "TSH", "T4TOTAL", "FREET4", "T3FREE", "THYROIDAB" in the last 72 hours. Anemia Panel: No results for input(s): "VITAMINB12", "FOLATE", "FERRITIN", "TIBC", "IRON", "RETICCTPCT" in the last 72 hours. Urine analysis: No results found for: "COLORURINE", "APPEARANCEUR", "LABSPEC", "PHURINE", "GLUCOSEU", "HGBUR", "BILIRUBINUR", "KETONESUR", "PROTEINUR", "UROBILINOGEN", "NITRITE", "LEUKOCYTESUR" Sepsis Labs: @LABRCNTIP (procalcitonin:4,lacticidven:4) )No results found for this or any previous visit (from the past 240 hour(s)).   Radiological Exams on Admission: No results found.   Assessment/Plan   1. Septic shock  - Developed rigors and hypotension after the procedure,  concerning for bacteremia  - Initial lactate is 4.3  - Culture blood, continue IV fluid resuscitation, trend lactate, start cefepime and Flagyl, follow cultures and clinical course    2. Cholangiocarcinoma with biliary obstruction   - She is s/p pancreaticoduodenectomy in 2019, 6 mos adjuvant gemcitabine/capecitabine  - PET (04/24/22) concerning for mets and MRI (05/17/22) concerning for biliary obstruction  - She underwent percutaneous transhepatic cholangiogram and biliary stent placement 06/02/22 with IR  - Drain care, follow output, follow-up cytology    3. Hypertension  - Hypotensive on admission, undergoing fluid-resuscitation, hold antihypertensives   4. Hyperlipidemia  - Transaminases increasing, hold statin for now    DVT prophylaxis: Lovenox  Code Status: Full, discussed with patient on admission  Level of Care:  Level of care: Progressive Family Communication: None present  Disposition Plan:  Patient is from: home  Anticipated d/c is to: TBD Anticipated d/c date is: 06/05/22  Patient currently: Pending sepsis workup and treatment  Consults called: none  Admission status: Inpatient     Briscoe Deutscher, MD [facility]  06/02/2022, 10:01 PM

## 2022-06-03 ENCOUNTER — Encounter (HOSPITAL_COMMUNITY): Payer: Self-pay

## 2022-06-03 ENCOUNTER — Inpatient Hospital Stay: Payer: Medicare HMO | Admitting: Nutrition

## 2022-06-03 DIAGNOSIS — A419 Sepsis, unspecified organism: Secondary | ICD-10-CM | POA: Diagnosis not present

## 2022-06-03 DIAGNOSIS — R6521 Severe sepsis with septic shock: Secondary | ICD-10-CM | POA: Diagnosis not present

## 2022-06-03 DIAGNOSIS — C221 Intrahepatic bile duct carcinoma: Secondary | ICD-10-CM | POA: Diagnosis not present

## 2022-06-03 DIAGNOSIS — K831 Obstruction of bile duct: Secondary | ICD-10-CM | POA: Diagnosis not present

## 2022-06-03 LAB — BLOOD CULTURE ID PANEL (REFLEXED) - BCID2

## 2022-06-03 LAB — CBC
HCT: 29.6 % — ABNORMAL LOW (ref 36.0–46.0)
Hemoglobin: 9.4 g/dL — ABNORMAL LOW (ref 12.0–15.0)
MCH: 27.4 pg (ref 26.0–34.0)
MCHC: 31.8 g/dL (ref 30.0–36.0)
MCV: 86.3 fL (ref 80.0–100.0)
Platelets: 318 10*3/uL (ref 150–400)
RBC: 3.43 MIL/uL — ABNORMAL LOW (ref 3.87–5.11)
RDW: 16.5 % — ABNORMAL HIGH (ref 11.5–15.5)
WBC: 34.5 10*3/uL — ABNORMAL HIGH (ref 4.0–10.5)
nRBC: 0 % (ref 0.0–0.2)

## 2022-06-03 LAB — MAGNESIUM: Magnesium: 1.5 mg/dL — ABNORMAL LOW (ref 1.7–2.4)

## 2022-06-03 LAB — COMPREHENSIVE METABOLIC PANEL
ALT: 50 U/L — ABNORMAL HIGH (ref 0–44)
AST: 101 U/L — ABNORMAL HIGH (ref 15–41)
Albumin: 1.5 g/dL — ABNORMAL LOW (ref 3.5–5.0)
Alkaline Phosphatase: 426 U/L — ABNORMAL HIGH (ref 38–126)
Anion gap: 15 (ref 5–15)
BUN: 12 mg/dL (ref 8–23)
CO2: 23 mmol/L (ref 22–32)
Calcium: 8.3 mg/dL — ABNORMAL LOW (ref 8.9–10.3)
Chloride: 99 mmol/L (ref 98–111)
Creatinine, Ser: 1.26 mg/dL — ABNORMAL HIGH (ref 0.44–1.00)
GFR, Estimated: 43 mL/min — ABNORMAL LOW (ref >60)
Glucose, Bld: 132 mg/dL — ABNORMAL HIGH (ref 70–99)
Potassium: 3.7 mmol/L (ref 3.5–5.1)
Sodium: 137 mmol/L (ref 135–145)
Total Bilirubin: 5.4 mg/dL — ABNORMAL HIGH (ref 0.3–1.2)
Total Protein: 5.9 g/dL — ABNORMAL LOW (ref 6.5–8.1)

## 2022-06-03 LAB — LACTIC ACID, PLASMA: Lactic Acid, Venous: 3.3 mmol/L (ref 0.5–1.9)

## 2022-06-03 MED ORDER — MAGNESIUM SULFATE 2 GM/50ML IV SOLN
2.0000 g | Freq: Once | INTRAVENOUS | Status: AC
  Administered 2022-06-03: 2 g via INTRAVENOUS
  Filled 2022-06-03: qty 50

## 2022-06-03 MED ORDER — ALBUMIN HUMAN 25 % IV SOLN
25.0000 g | Freq: Once | INTRAVENOUS | Status: AC
  Administered 2022-06-03: 25 g via INTRAVENOUS
  Filled 2022-06-03: qty 100

## 2022-06-03 MED ORDER — SODIUM CHLORIDE 0.9 % IV SOLN: 2.0000 g | Freq: Two times a day (BID) | INTRAVENOUS | Status: DC

## 2022-06-03 MED ORDER — SODIUM CHLORIDE 0.9 % IV SOLN
2.0000 g | INTRAVENOUS | Status: DC
  Administered 2022-06-03: 2 g via INTRAVENOUS
  Filled 2022-06-03: qty 20

## 2022-06-03 NOTE — Discharge Instructions (Addendum)
PER MEDICAL ONCOLOGY: Follow up on 06/11/22 at 0915 for lab and office visit with Ned Card, NP at [facility]

## 2022-06-03 NOTE — Progress Notes (Signed)
PHARMACY NOTE:  ANTIMICROBIAL RENAL DOSAGE ADJUSTMENT  Current antimicrobial regimen includes a mismatch between antimicrobial dosage and estimated renal function.  As per policy approved by the Pharmacy & Therapeutics and Medical Executive Committees, the antimicrobial dosage will be adjusted accordingly.  Current antimicrobial dosage:  cefepime 2gm IV q8h   Indication: IAI  Renal Function:  Estimated Creatinine Clearance: 47 mL/min (A) (by C-G formula based on SCr of 1.26 mg/dL (H)). '[]'$      On intermittent HD, scheduled: '[]'$      On CRRT    Antimicrobial dosage has been changed to:  Cefepime 2gm IV q12h  Additional comments:   Barbara Byrd A. Levada Dy, PharmD, BCPS, FNKF Clinical Pharmacist [place] Please utilize Amion for appropriate phone number to reach the unit pharmacist ([place])  06/03/2022 1:55 PM

## 2022-06-03 NOTE — Progress Notes (Signed)
PHARMACY - PHYSICIAN COMMUNICATION CRITICAL VALUE ALERT - BLOOD CULTURE IDENTIFICATION (BCID)  Barbara Byrd is an 81 y.o. female who presented to [facility] on 06/02/2022 with a chief complaint of biliary drain placement. Started on abx for sepsis after drain placement  Assessment:  Pt with Ecoli bacteremia - no resistance (suspected source -intra abd)  Name of physician (or Provider) Contacted: Dr. Mal Misty  Current antibiotics: Cefepime and Flagyl  Changes to prescribed antibiotics recommended:  Per BCID protocol, antibiotics will be narrowed to Rocephin 2gm IV q24h  Results for orders placed or performed during the hospital encounter of 06/02/22  Blood Culture ID Panel (Reflexed) (Collected: 06/02/2022  8:16 PM)  Result Value Ref Range   Enterococcus faecalis NOT DETECTED NOT DETECTED   Enterococcus Faecium NOT DETECTED NOT DETECTED   Listeria monocytogenes NOT DETECTED NOT DETECTED   Staphylococcus species NOT DETECTED NOT DETECTED   Staphylococcus aureus (BCID) NOT DETECTED NOT DETECTED   Staphylococcus epidermidis NOT DETECTED NOT DETECTED   Staphylococcus lugdunensis NOT DETECTED NOT DETECTED   Streptococcus species NOT DETECTED NOT DETECTED   Streptococcus agalactiae NOT DETECTED NOT DETECTED   Streptococcus pneumoniae NOT DETECTED NOT DETECTED   Streptococcus pyogenes NOT DETECTED NOT DETECTED   A.calcoaceticus-baumannii NOT DETECTED NOT DETECTED   Bacteroides fragilis NOT DETECTED NOT DETECTED   Enterobacterales DETECTED (A) NOT DETECTED   Enterobacter cloacae complex NOT DETECTED NOT DETECTED   Escherichia coli DETECTED (A) NOT DETECTED   Klebsiella aerogenes NOT DETECTED NOT DETECTED   Klebsiella oxytoca NOT DETECTED NOT DETECTED   Klebsiella pneumoniae NOT DETECTED NOT DETECTED   Proteus species NOT DETECTED NOT DETECTED   Salmonella species NOT DETECTED NOT DETECTED   Serratia marcescens NOT DETECTED NOT DETECTED   Haemophilus influenzae NOT DETECTED NOT DETECTED    Neisseria meningitidis NOT DETECTED NOT DETECTED   Pseudomonas aeruginosa NOT DETECTED NOT DETECTED   Stenotrophomonas maltophilia NOT DETECTED NOT DETECTED   Candida albicans NOT DETECTED NOT DETECTED   Candida auris NOT DETECTED NOT DETECTED   Candida glabrata NOT DETECTED NOT DETECTED   Candida krusei NOT DETECTED NOT DETECTED   Candida parapsilosis NOT DETECTED NOT DETECTED   Candida tropicalis NOT DETECTED NOT DETECTED   Cryptococcus neoformans/gattii NOT DETECTED NOT DETECTED   CTX-M ESBL NOT DETECTED NOT DETECTED   Carbapenem resistance IMP NOT DETECTED NOT DETECTED   Carbapenem resistance KPC NOT DETECTED NOT DETECTED   Carbapenem resistance NDM NOT DETECTED NOT DETECTED   Carbapenem resist OXA 48 LIKE NOT DETECTED NOT DETECTED   Carbapenem resistance VIM NOT DETECTED NOT DETECTED    Sherlon Handing, PharmD, BCPS Please see amion for complete clinical pharmacist phone list 06/03/2022  3:56 PM

## 2022-06-03 NOTE — Progress Notes (Signed)
  Transition of Care [facility]) Screening Note   Patient Details  Name: Barbara Byrd Date of Birth: [birth year]   Transition of Care [facility]) CM/SW Contact:    Cyndi Bender, RN Phone Number: 06/03/2022, 8:29 AM    Transition of Care Department [facility]) has reviewed patient and no TOC needs have been identified at this time. We will continue to monitor patient advancement through interdisciplinary progression rounds. If new patient transition needs arise, please place a TOC consult.

## 2022-06-03 NOTE — Progress Notes (Addendum)
Progress Note    Barbara Byrd  OAC:[id number] DOB: 1941-03-13  DOA: 06/02/2022 PCP: Malena Peer, MD      Brief Narrative:    Medical records reviewed and are as summarized below:  Barbara Byrd is a 81 y.o. 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  Shortly after biliary drain was placed by IR, she developed rigors, bradycardia, low blood pressure, nausea and vomiting   She was admitted to the hospital for septic shock    Assessment/Plan:   Principal Problem:   Biliary obstruction Active Problems:   Cholangiocarcinoma ([place])   Hypertension   Hyperlipidemia   Septic shock ([place])   Body mass index is 46.86 kg/m.  (Morbid obesity)  Septic shock probably from acute cholangitis, severe leukocytosis: Continue empiric IV antibiotics.  Analgesics as needed for pain.  Follow-up blood cultures.  Cholangiocarcinoma with biliary obstruction: - She is s/p pancreaticoduodenectomy in 2019, 6 mos adjuvant gemcitabine/capecitabine  - PET (04/24/22) concerning for mets and MRI (05/17/22) concerning for biliary obstruction  - She underwent percutaneous transhepatic cholangiogram and biliary stent placement 06/02/22 with IR  - Drain care, follow output, follow-up cytology  Hypomagnesemia: Replete with IV magnesium sulfate and monitor levels.  History of hypertension: Antihypertensives on hold  Hyperlipidemia: Statin on hold because of elevated liver enzymes  Diet Order             Diet regular Room service appropriate? No; Fluid consistency: Thin  Diet effective now                            Consultants: None  Procedures: Biliary drain  placement    Medications:    enoxaparin (LOVENOX) injection  40 mg Subcutaneous Q24H   sodium chloride flush  3 mL Intravenous Q12H   sodium chloride flush  5 mL Intracatheter Q8H   Continuous Infusions:  sodium chloride 10 mL/hr at 06/02/22 1435   ceFEPime (MAXIPIME) IV 2 g (06/03/22 0557)   metronidazole 500 mg (06/03/22 0855)     Anti-infectives (From admission, onward)    Start     Dose/Rate Route Frequency Ordered Stop   06/03/22 0600  ceFEPIme (MAXIPIME) 2 g in sodium chloride 0.9 % 100 mL IVPB  Status:  Discontinued        2 g 200 mL/hr over 30 Minutes Intravenous Every 8 hours 06/02/22 1958 06/02/22 2000   06/03/22 0600  ceFEPIme (MAXIPIME) 2 g in sodium chloride 0.9 % 100 mL IVPB        2 g 200 mL/hr over 30 Minutes Intravenous Every 8 hours 06/02/22 2000 06/09/22 2159   06/02/22 2030  ceFEPIme (MAXIPIME) 2 g in sodium chloride 0.9 % 100 mL IVPB        2 g 200 mL/hr over 30 Minutes Intravenous  Once 06/02/22 1944 06/02/22 2248   06/02/22 2030  metroNIDAZOLE (FLAGYL) IVPB 500 mg        500 mg 100 mL/hr over 60 Minutes Intravenous Every 12 hours 06/02/22 1944 06/09/22 2029   06/02/22 1345  cefOXitin (MEFOXIN) 2 g in sodium chloride 0.9 % 100 mL IVPB        2 g 200 mL/hr over 30 Minutes Intravenous To Radiology 06/02/22  1109 06/02/22 1739              Family Communication/Anticipated D/C date and plan/Code Status   DVT prophylaxis: enoxaparin (LOVENOX) injection 40 mg Start: 06/02/22 2100     Code Status: Full Code  Family Communication: None Disposition Plan: Plan to discharge home in 2 to 3 days   Status is: Inpatient Remains inpatient appropriate because: Sepsis on IV antibiotics       Subjective:   Interval events noted.  She complains of abdominal pain.  No vomiting, chest pain or shortness of breath.  Objective:    Vitals:   06/03/22 0250 06/03/22 0255 06/03/22 0400 06/03/22 0800  BP:  (!) 95/56 (!) 101/58 (!) 107/57  Pulse: 84 84  81 78  Resp:   20 18  Temp:   98.8 F (37.1 C) 98.2 F (36.8 C)  TempSrc:   Axillary Axillary  SpO2: 98% 99% 98% 98%  Weight:      Height:       No data found.   Intake/Output Summary (Last 24 hours) at 06/03/2022 1237 Last data filed at 06/03/2022 0558 Gross per 24 hour  Intake 513 ml  Output 200 ml  Net 313 ml   Filed Weights   06/02/22 0954  Weight: 123.8 kg    Exam:  GEN: NAD SKIN: No rash EYES: EOMI, icteric, no pallor ENT: MMM CV: RRR PULM: CTA B ABD: soft, obese, mild mid abdominal tenderness, no rebound tenderness or guarding, +BS,  + biliary drain with greenish fluid. CNS: AAO x 3, non focal EXT: No edema or tenderness        Data Reviewed:   I have personally reviewed following labs and imaging studies:  Labs: Labs show the following:   Basic Metabolic Panel: Recent Labs  Lab 05/29/22 0931 06/02/22 1145 06/03/22 0044  NA 131* 134* 137  K 4.1 4.0 3.7  CL 93* 93* 99  CO2 '27 27 23  '$ GLUCOSE 196* 185* 132*  BUN '12 11 12  '$ CREATININE 0.94 1.05* 1.26*  CALCIUM 9.9 9.2 8.3*  MG  --   --  1.5*   GFR Estimated Creatinine Clearance: 47 mL/min (A) (by C-G formula based on SCr of 1.26 mg/dL (H)). Liver Function Tests: Recent Labs  Lab 05/29/22 0931 06/02/22 1145 06/03/22 0044  AST 72* 102* 101*  ALT 55* 62* 50*  ALKPHOS 602* 592* 426*  BILITOT 4.3* 6.3* 5.4*  PROT 7.8 7.7 5.9*  ALBUMIN 3.0* 2.0* 1.5*   No results for input(s): "LIPASE", "AMYLASE" in the last 168 hours. No results for input(s): "AMMONIA" in the last 168 hours. Coagulation profile Recent Labs  Lab 06/02/22 1218  INR 1.3*    CBC: Recent Labs  Lab 05/29/22 0931 06/02/22 1145 06/03/22 0044  WBC 17.2* 20.3* 34.5*  NEUTROABS 13.6*  --   --   HGB 11.2* 11.6* 9.4*  HCT 33.5* 35.3* 29.6*  MCV 83.1 85.7 86.3  PLT 509* 454* 318   Cardiac Enzymes: No results for input(s): "CKTOTAL", "CKMB", "CKMBINDEX", "TROPONINI" in the last 168 hours. BNP (last 3 results) No  results for input(s): "PROBNP" in the last 8760 hours. CBG: No results for input(s): "GLUCAP" in the last 168 hours. D-Dimer: No results for input(s): "DDIMER" in the last 72 hours. Hgb A1c: No results for input(s): "HGBA1C" in the last 72 hours. Lipid Profile: No results for input(s): "CHOL", "HDL", "LDLCALC", "TRIG", "CHOLHDL", "LDLDIRECT" in the last 72 hours. Thyroid function studies: No results for input(s): "TSH", "T4TOTAL", "  T3FREE", "THYROIDAB" in the last 72 hours.  Invalid input(s): "FREET3" Anemia work up: No results for input(s): "VITAMINB12", "FOLATE", "FERRITIN", "TIBC", "IRON", "RETICCTPCT" in the last 72 hours. Sepsis Labs: Recent Labs  Lab 05/29/22 0931 06/02/22 1145 06/02/22 2017 06/02/22 2340 06/03/22 0044  WBC 17.2* 20.3*  --   --  34.5*  LATICACIDVEN  --   --  4.3* 3.3*  --     Microbiology Recent Results (from the past 240 hour(s))  Culture, blood (x 2)     Status: None (Preliminary result)   Collection Time: 06/02/22  8:16 PM   Specimen: BLOOD  Result Value Ref Range Status   Specimen Description BLOOD SITE NOT SPECIFIED  Final   Special Requests   Final    BOTTLES DRAWN AEROBIC AND ANAEROBIC Blood Culture adequate volume   Culture   Final    NO GROWTH < 12 HOURS Performed at [facility], [place] [address] [place], [place] [address]    Report Status PENDING  Incomplete  Culture, blood (x 2)     Status: None (Preliminary result)   Collection Time: 06/02/22  8:16 PM   Specimen: BLOOD  Result Value Ref Range Status   Specimen Description BLOOD SITE NOT SPECIFIED  Final   Special Requests   Final    BOTTLES DRAWN AEROBIC AND ANAEROBIC Blood Culture adequate volume   Culture   Final    NO GROWTH < 12 HOURS Performed at [facility], [place] [address], [place], [place] [address]    Report Status PENDING  Incomplete    Procedures and diagnostic studies:  IR INT EXT BILIARY DRAIN WITH CHOLANGIOGRAM  Result Date: 06/03/2022 INDICATION:  81 year old with history of common bile duct cholangiocarcinoma and status post Whipple procedure in 2019. Patient now has elevated bilirubin level and concern for recurrent cancer. Patient is scheduled for placement of a percutaneous transhepatic cholangiogram and biliary drain. EXAM: 1. Percutaneous transhepatic cholangiogram using ultrasound and fluoroscopic guidance 2. Placement of biliary drain MEDICATIONS: Cefoxitin 2 g; The antibiotic was administered within an appropriate time frame prior to the initiation of the procedure. ANESTHESIA/SEDATION: Moderate (conscious) sedation was employed during this procedure. A total of Versed 4.0 mg and Fentanyl 150 mcg was administered intravenously by the radiology nurse. Total intra-service moderate Sedation Time: 72 minutes. The patient's level of consciousness and vital signs were monitored continuously by radiology nursing throughout the procedure under my direct supervision. FLUOROSCOPY: Radiation Exposure Index (as provided by the fluoroscopic device): 54 mGy Kerma COMPLICATIONS: None immediate. PROCEDURE: Informed written consent was obtained from the patient after a thorough discussion of the procedural risks, benefits and alternatives. All questions were addressed. Maximal Sterile Barrier Technique was utilized including caps, mask, sterile gowns, sterile gloves, sterile drape, hand hygiene and skin antiseptic. A timeout was performed prior to the initiation of the procedure. Patient was placed supine on the interventional table. The anterior and right side of the abdomen was prepped and draped in sterile fashion. Liver was thoroughly evaluated with ultrasound. The right side of the abdomen was anesthetized with 1% lidocaine and a 22 gauge needle was directed into the right hepatic lobe with ultrasound guidance. There was minimal peripheral bile duct dilatation based on ultrasound. Therefore, the needle was advanced centrally into the liver using fluoroscopy.  Needle was slowly pulled back under fluoroscopy as the needle was injected with contrast. Biliary system was opacified with contrast. Cholangiogram only opacified a few central ducts and contrast was draining into the  small bowel via the surgical anastomosis. Based on the minimal right biliary dilatation and lack of obstruction at the surgical anastomosis, attention was directed to the dilated left hepatic ducts. Using ultrasound guidance, a 21 and 22 gauge needles were directed into the left hepatic lobe. A dilated structure thought to represent the biliary system appeared to represent a portal vein although there was not much color Doppler flow within this structure. Another dilated structure in the left hepatic lobe was punctured with ultrasound and contrast demonstrated slow filling of the structure with filling defects. Eventually, contrast was draining through the surgical anastomosis into the small bowel. 0.018 wire was easily advanced into the small bowel. A transitional dilator set was placed. Super stiff Amplatz wire was placed and the tract was dilated to accommodate a 10 [place] biliary drain which was advanced into the small bowel. Sample of bile was collected and sent for cytology. Drain was attached to a gravity bag and sutured in place. Dressing was placed. Procedure was performed by Dr. Markus Daft and Dr. Daryll Brod. FINDINGS: Ultrasound demonstrated central intrahepatic biliary dilatation but minimal peripheral biliary dilatation. Percutaneous transhepatic cholangiogram from the right hepatic lobe demonstrated irregular dilated central bile ducts with drainage through the surgical anastomosis into the small bowel. There appeared to be narrowing at the surgical anastomosis. Percutaneous transhepatic cholangiogram from a dilated left hepatic bile duct demonstrated filling defects within the bile duct. Contrast eventually drained into the small bowel through the hepaticojejunostomy. Interestingly, no  other significant bile ducts were communicating with this dilated left bile duct. Drain extends into the small bowel. IMPRESSION: 1. Percutaneous transhepatic cholangiogram demonstrates dilated central bile ducts with a patent hepaticojejunostomy. There appeared to be narrowing at the anastomosis on the right cholangiogram and there were filling defects within a dilated left bile duct. Filling defects within the dilated left bile duct could represent sludge or clot. There was minimal communication between the intrahepatic bile ducts which may be related to the patent hepaticojejunostomy. 2. Internal/external biliary drain was placed via a dilated left hepatic bile duct. Drain extends into the small bowel. 3. Patient will eventually need a follow-up cholangiogram to evaluate the intrahepatic biliary system and may need a brush biopsy. Electronically Signed   By: Markus Daft M.D.   On: 06/03/2022 07:49               LOS: 1 day   Leland Staszewski  [facility]   Pager on [email or web address]. If 7PM-7AM, please contact night-coverage at [email or web address]     06/03/2022, 12:37 PM

## 2022-06-03 NOTE — Progress Notes (Signed)
Referring Physician(s): Ladell Pier  Supervising Physician: Arne Cleveland  Patient Status:  [facility] - In-pt  Chief Complaint: Percutaneous transhepatic cholangiogram and placement of biliary drain 06/02/22 with Dr. Anselm Pancoast    Subjective:  Pt sitting upright on side of bed with family at bedside. She c/o abdominal pain no worse than her baseline. Pt endorses intermittent diaphoresis but states she is feeling much better than yesterday. She denies tenderness to insertion site. She is eating/drinking w/o nausea.    Allergies: Patient has no known allergies.  Medications: Prior to Admission medications   Medication Sig Start Date End Date Taking? Authorizing Provider  amLODipine (NORVASC) 10 MG tablet Take 10 mg by mouth daily. 04/15/21  Yes [provider]  bisoprolol-hydrochlorothiazide (ZIAC) 5-6.25 MG tablet Take 1 tablet by mouth daily. 04/15/21  Yes [provider]  potassium chloride (KLOR-CON) 10 MEQ tablet Take 1 tablet (10 mEq total) by mouth daily. 04/20/22  Yes Owens Shark, NP  psyllium (METAMUCIL) 58.6 % packet Take 1 packet by mouth daily as needed (constipation).   Yes [provider]  rosuvastatin (CRESTOR) 5 MG tablet Take 5 mg by mouth daily. 04/15/21  Yes [provider]  traMADol (ULTRAM) 50 MG tablet Take 1 tablet (50 mg total) by mouth every 6 (six) hours as needed for moderate pain. 05/14/22  Yes Owens Shark, NP     Vital Signs: BP (!) 121/58 (BP Location: Left Arm)   Pulse 88   Temp 98.2 F (36.8 C) (Axillary)   Resp 18   Ht '5\' 4"'$  (1.626 m)   Wt 273 lb (123.8 kg) Comment: pt states this is her weight today  SpO2 94%   BMI 46.86 kg/m   Physical Exam Vitals reviewed.  Constitutional:      General: She is not in acute distress.    Appearance: She is ill-appearing.  Eyes:     Extraocular Movements: Extraocular movements intact.     Pupils: Pupils are equal, round, and reactive to light.  Pulmonary:      Effort: Pulmonary effort is normal. No respiratory distress.  Abdominal:     Comments: Insertion site unremarkable with sutures/statlock in place.  Dressing C/D/I. F/A easily.   Skin:    General: Skin is warm.  Neurological:     Mental Status: She is alert and oriented to person, place, and time.  Psychiatric:        Mood and Affect: Mood normal.        Behavior: Behavior normal.        Thought Content: Thought content normal.        Judgment: Judgment normal.     Imaging: IR INT EXT BILIARY DRAIN WITH CHOLANGIOGRAM  Result Date: 06/03/2022 INDICATION: 81 year old with history of common bile duct cholangiocarcinoma and status post Whipple procedure in 2019. Patient now has elevated bilirubin level and concern for recurrent cancer. Patient is scheduled for placement of a percutaneous transhepatic cholangiogram and biliary drain. EXAM: 1. Percutaneous transhepatic cholangiogram using ultrasound and fluoroscopic guidance 2. Placement of biliary drain MEDICATIONS: Cefoxitin 2 g; The antibiotic was administered within an appropriate time frame prior to the initiation of the procedure. ANESTHESIA/SEDATION: Moderate (conscious) sedation was employed during this procedure. A total of Versed 4.0 mg and Fentanyl 150 mcg was administered intravenously by the radiology nurse. Total intra-service moderate Sedation Time: 72 minutes. The patient's level of consciousness and vital signs were monitored continuously by radiology nursing throughout the procedure under my direct supervision. FLUOROSCOPY:  Radiation Exposure Index (as provided by the fluoroscopic device): 54 mGy Kerma COMPLICATIONS: None immediate. PROCEDURE: Informed written consent was obtained from the patient after a thorough discussion of the procedural risks, benefits and alternatives. All questions were addressed. Maximal Sterile Barrier Technique was utilized including caps, mask, sterile gowns, sterile gloves, sterile drape, hand hygiene and  skin antiseptic. A timeout was performed prior to the initiation of the procedure. Patient was placed supine on the interventional table. The anterior and right side of the abdomen was prepped and draped in sterile fashion. Liver was thoroughly evaluated with ultrasound. The right side of the abdomen was anesthetized with 1% lidocaine and a 22 gauge needle was directed into the right hepatic lobe with ultrasound guidance. There was minimal peripheral bile duct dilatation based on ultrasound. Therefore, the needle was advanced centrally into the liver using fluoroscopy. Needle was slowly pulled back under fluoroscopy as the needle was injected with contrast. Biliary system was opacified with contrast. Cholangiogram only opacified a few central ducts and contrast was draining into the small bowel via the surgical anastomosis. Based on the minimal right biliary dilatation and lack of obstruction at the surgical anastomosis, attention was directed to the dilated left hepatic ducts. Using ultrasound guidance, a 21 and 22 gauge needles were directed into the left hepatic lobe. A dilated structure thought to represent the biliary system appeared to represent a portal vein although there was not much color Doppler flow within this structure. Another dilated structure in the left hepatic lobe was punctured with ultrasound and contrast demonstrated slow filling of the structure with filling defects. Eventually, contrast was draining through the surgical anastomosis into the small bowel. 0.018 wire was easily advanced into the small bowel. A transitional dilator set was placed. Super stiff Amplatz wire was placed and the tract was dilated to accommodate a 10 [place] biliary drain which was advanced into the small bowel. Sample of bile was collected and sent for cytology. Drain was attached to a gravity bag and sutured in place. Dressing was placed. Procedure was performed by Dr. Markus Daft and Dr. Daryll Brod. FINDINGS:  Ultrasound demonstrated central intrahepatic biliary dilatation but minimal peripheral biliary dilatation. Percutaneous transhepatic cholangiogram from the right hepatic lobe demonstrated irregular dilated central bile ducts with drainage through the surgical anastomosis into the small bowel. There appeared to be narrowing at the surgical anastomosis. Percutaneous transhepatic cholangiogram from a dilated left hepatic bile duct demonstrated filling defects within the bile duct. Contrast eventually drained into the small bowel through the hepaticojejunostomy. Interestingly, no other significant bile ducts were communicating with this dilated left bile duct. Drain extends into the small bowel. IMPRESSION: 1. Percutaneous transhepatic cholangiogram demonstrates dilated central bile ducts with a patent hepaticojejunostomy. There appeared to be narrowing at the anastomosis on the right cholangiogram and there were filling defects within a dilated left bile duct. Filling defects within the dilated left bile duct could represent sludge or clot. There was minimal communication between the intrahepatic bile ducts which may be related to the patent hepaticojejunostomy. 2. Internal/external biliary drain was placed via a dilated left hepatic bile duct. Drain extends into the small bowel. 3. Patient will eventually need a follow-up cholangiogram to evaluate the intrahepatic biliary system and may need a brush biopsy. Electronically Signed   By: Markus Daft M.D.   On: 06/03/2022 07:49    Labs:  CBC: Recent Labs    04/20/22 0825 05/29/22 0931 06/02/22 1145 06/03/22 0044  WBC 11.1* 17.2* 20.3* 34.5*  HGB 11.1* 11.2* 11.6* 9.4*  HCT 35.2* 33.5* 35.3* 29.6*  PLT 373 509* 454* 318    COAGS: Recent Labs    06/02/22 1218  INR 1.3*    BMP: Recent Labs    04/20/22 0825 05/29/22 0931 06/02/22 1145 06/03/22 0044  NA 136 131* 134* 137  K 3.2* 4.1 4.0 3.7  CL 95* 93* 93* 99  CO2 '30 27 27 23  '$ GLUCOSE 136*  196* 185* 132*  BUN '15 12 11 12  '$ CALCIUM 9.6 9.9 9.2 8.3*  CREATININE 0.92 0.94 1.05* 1.26*  GFRNONAA >60 >60 54* 43*    LIVER FUNCTION TESTS: Recent Labs    04/20/22 0825 05/29/22 0931 06/02/22 1145 06/03/22 0044  BILITOT 1.5* 4.3* 6.3* 5.4*  AST 56* 72* 102* 101*  ALT 38 55* 62* 50*  ALKPHOS 468* 602* 592* 426*  PROT 7.9 7.8 7.7 5.9*  ALBUMIN 3.3* 3.0* 2.0* 1.5*    Assessment and Plan:  Insertion site unremarkable with sutures/statlock in place.  Dressing C/D/I. F/A easily.  ~ 100 cc bilious OP in gravity bag. 250 cc documented OP.   WBC 34.5 (20.3) Tmax 98.8 T bili 5.4 (6.3)  Continue to flush drain with 5 cc NS TID. Record OP q shift.  Change dressing q shift or as needed to keep clean and dry.  Contact IR if difficulty flushing or if there is a sudden change in OP.   IR to follow. Please call with questions/concerns.   Electronically Signed: Tyson Alias, NP 06/03/2022, 3:24 PM   I spent a total of 15 Minutes at the the patient's bedside AND on the patient's hospital floor or unit, greater than 50% of which was counseling/coordinating care for biliary drain.

## 2022-06-04 ENCOUNTER — Telehealth: Payer: Self-pay

## 2022-06-04 DIAGNOSIS — R6521 Severe sepsis with septic shock: Secondary | ICD-10-CM | POA: Diagnosis not present

## 2022-06-04 DIAGNOSIS — C221 Intrahepatic bile duct carcinoma: Secondary | ICD-10-CM | POA: Diagnosis not present

## 2022-06-04 DIAGNOSIS — A419 Sepsis, unspecified organism: Secondary | ICD-10-CM | POA: Diagnosis not present

## 2022-06-04 DIAGNOSIS — K831 Obstruction of bile duct: Secondary | ICD-10-CM | POA: Diagnosis not present

## 2022-06-04 DIAGNOSIS — R7881 Bacteremia: Secondary | ICD-10-CM

## 2022-06-04 DIAGNOSIS — B9620 Unspecified Escherichia coli [E. coli] as the cause of diseases classified elsewhere: Secondary | ICD-10-CM

## 2022-06-04 LAB — COMPREHENSIVE METABOLIC PANEL
ALT: 39 U/L (ref 0–44)
AST: 59 U/L — ABNORMAL HIGH (ref 15–41)
Albumin: 1.7 g/dL — ABNORMAL LOW (ref 3.5–5.0)
Alkaline Phosphatase: 342 U/L — ABNORMAL HIGH (ref 38–126)
Anion gap: 10 (ref 5–15)
BUN: 17 mg/dL (ref 8–23)
CO2: 26 mmol/L (ref 22–32)
Calcium: 8.3 mg/dL — ABNORMAL LOW (ref 8.9–10.3)
Chloride: 101 mmol/L (ref 98–111)
Creatinine, Ser: 1.19 mg/dL — ABNORMAL HIGH (ref 0.44–1.00)
GFR, Estimated: 47 mL/min — ABNORMAL LOW (ref >60)
Glucose, Bld: 157 mg/dL — ABNORMAL HIGH (ref 70–99)
Potassium: 3.7 mmol/L (ref 3.5–5.1)
Sodium: 137 mmol/L (ref 135–145)
Total Bilirubin: 3.8 mg/dL — ABNORMAL HIGH (ref 0.3–1.2)
Total Protein: 6 g/dL — ABNORMAL LOW (ref 6.5–8.1)

## 2022-06-04 LAB — CYTOLOGY - NON PAP

## 2022-06-04 LAB — CBC
HCT: 28 % — ABNORMAL LOW (ref 36.0–46.0)
Hemoglobin: 9 g/dL — ABNORMAL LOW (ref 12.0–15.0)
MCH: 27.8 pg (ref 26.0–34.0)
MCHC: 32.1 g/dL (ref 30.0–36.0)
MCV: 86.4 fL (ref 80.0–100.0)
Platelets: 252 10*3/uL (ref 150–400)
RBC: 3.24 MIL/uL — ABNORMAL LOW (ref 3.87–5.11)
RDW: 16.7 % — ABNORMAL HIGH (ref 11.5–15.5)
WBC: 21.5 10*3/uL — ABNORMAL HIGH (ref 4.0–10.5)
nRBC: 0 % (ref 0.0–0.2)

## 2022-06-04 LAB — PHOSPHORUS: Phosphorus: 3.2 mg/dL (ref 2.5–4.6)

## 2022-06-04 LAB — MAGNESIUM: Magnesium: 2.3 mg/dL (ref 1.7–2.4)

## 2022-06-04 MED ORDER — METRONIDAZOLE 500 MG/100ML IV SOLN: 500.0000 mg | Freq: Two times a day (BID) | INTRAVENOUS | Status: DC

## 2022-06-04 MED ORDER — LEVOFLOXACIN 750 MG PO TABS
750.0000 mg | ORAL_TABLET | Freq: Every day | ORAL | Status: DC
Start: 2022-06-04 — End: 2022-06-05
  Administered 2022-06-04 – 2022-06-05 (×2): 750 mg via ORAL
  Filled 2022-06-04 (×2): qty 1

## 2022-06-04 MED ORDER — ENOXAPARIN SODIUM 60 MG/0.6ML IJ SOSY
60.0000 mg | PREFILLED_SYRINGE | INTRAMUSCULAR | Status: DC
  Administered 2022-06-04: 60 mg via SUBCUTANEOUS
  Filled 2022-06-04: qty 0.6

## 2022-06-04 MED ORDER — METRONIDAZOLE 500 MG PO TABS
500.0000 mg | ORAL_TABLET | Freq: Two times a day (BID) | ORAL | Status: DC
  Administered 2022-06-04 – 2022-06-05 (×3): 500 mg via ORAL
  Filled 2022-06-04 (×3): qty 1

## 2022-06-04 NOTE — Plan of Care (Signed)

## 2022-06-04 NOTE — Progress Notes (Addendum)
Progress Note    Barbara Byrd  SWN:[id number] DOB: [birth year]  DOA: 06/02/2022 PCP: Malena Peer, MD      Brief Narrative:    Medical records reviewed and are as summarized below:  Barbara Byrd is a 81 y.o. 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  Shortly after biliary drain was placed by IR, she developed rigors, bradycardia, low blood pressure, nausea and vomiting   She was admitted to the hospital for septic shock probably from acute cholangitis.  She was treated with IV fluids, empiric IV antibiotics and analgesics.  Blood culture showed E. coli consistent with E. coli bacteremia.    Assessment/Plan:   Principal Problem:   Septic shock (HCC) Active Problems:   E coli bacteremia   Cholangiocarcinoma (HCC)   Biliary obstruction   Hypertension   Hyperlipidemia   Body mass index is 46.86 kg/m.  (Morbid obesity)  Septic shock probably from acute cholangitis and E. coli bacteremia, severe leukocytosis: Blood culture showed E. coli.  Sensitivity report is pending.  Unfortunately, patient lost IV access and IV team could not establish a new peripheral IV access.  I would like to avoid a midline catheter or PICC line at this time since I do not anticipate long-term antibiotics.  IV ceftriaxone will be switched to oral Levaquin.  Change IV Flagyl to oral Flagyl.    Cholangiocarcinoma with biliary obstruction: - She is s/p pancreaticoduodenectomy in 2019, 6 mos adjuvant gemcitabine/capecitabine  - PET (04/24/22) concerning for mets and MRI (05/17/22) concerning for biliary obstruction  - She underwent percutaneous transhepatic cholangiogram and biliary stent  placement 06/02/22 with IR  Liver enzymes are improving. - Drain care, follow output, follow-up cytology  Hypomagnesemia: Improved.  History of hypertension: Antihypertensives on hold  Hyperlipidemia: Statin on hold because of elevated liver enzymes  Diet Order             Diet regular Room service appropriate? No; Fluid consistency: Thin  Diet effective now                            Consultants: None  Procedures: Biliary drain placement    Medications:    enoxaparin (LOVENOX) injection  60 mg Subcutaneous Q24H   sodium chloride flush  3 mL Intravenous Q12H   sodium chloride flush  5 mL Intracatheter Q8H   Continuous Infusions:  sodium chloride 10 mL/hr at 06/02/22 1435   cefTRIAXone (ROCEPHIN)  IV 2 g (06/03/22 1737)   metronidazole       Anti-infectives (From admission, onward)    Start     Dose/Rate Route Frequency Ordered Stop   06/04/22 1215  metroNIDAZOLE (FLAGYL) IVPB 500 mg        500 mg 100 mL/hr over 60 Minutes Intravenous Every 12 hours 06/04/22 1121 06/09/22 2359   06/03/22 2200  ceFEPIme (MAXIPIME) 2 g in sodium chloride 0.9 % 100 mL IVPB  Status:  Discontinued        2 g 200 mL/hr over 30 Minutes Intravenous Every 12 hours 06/03/22 1335 06/03/22 1559   06/03/22 1800  cefTRIAXone (ROCEPHIN) 2 g in sodium chloride 0.9 % 100 mL IVPB        2 g 200  mL/hr over 30 Minutes Intravenous Every 24 hours 06/03/22 1559 06/10/22 1759   06/03/22 0600  ceFEPIme (MAXIPIME) 2 g in sodium chloride 0.9 % 100 mL IVPB  Status:  Discontinued        2 g 200 mL/hr over 30 Minutes Intravenous Every 8 hours 06/02/22 1958 06/02/22 2000   06/03/22 0600  ceFEPIme (MAXIPIME) 2 g in sodium chloride 0.9 % 100 mL IVPB  Status:  Discontinued        2 g 200 mL/hr over 30 Minutes Intravenous Every 8 hours 06/02/22 2000 06/03/22 1335   06/02/22 2030  ceFEPIme (MAXIPIME) 2 g in sodium chloride 0.9 % 100 mL IVPB        2 g 200 mL/hr over 30 Minutes Intravenous  Once  06/02/22 1944 06/02/22 2248   06/02/22 2030  metroNIDAZOLE (FLAGYL) IVPB 500 mg  Status:  Discontinued        500 mg 100 mL/hr over 60 Minutes Intravenous Every 12 hours 06/02/22 1944 06/03/22 1559   06/02/22 1345  cefOXitin (MEFOXIN) 2 g in sodium chloride 0.9 % 100 mL IVPB        2 g 200 mL/hr over 30 Minutes Intravenous To Radiology 06/02/22 1109 06/02/22 1739              Family Communication/Anticipated D/C date and plan/Code Status   DVT prophylaxis:      Code Status: Full Code  Family Communication: None Disposition Plan: Plan to discharge home in 1 to 2 days   Status is: Inpatient Remains inpatient appropriate because: Sepsis on IV antibiotics       Subjective:   Interval events noted.  Abdominal pain is better.  She feels better than she did yesterday.  No shortness of breath or chest pain.  Objective:    Vitals:   06/02/22 0954 06/04/22 0000 06/04/22 0747 06/04/22 1223  BP:  (!) 99/57 124/82 117/71  Pulse:  72 85 81  Resp:  '20 17 15  '$ Temp:  98.7 F (37.1 C) 98.8 F (37.1 C) 97.8 F (36.6 C)  TempSrc:  Oral Oral Oral  SpO2:  95% 99% 99%  Weight: 123.8 kg     Height: '5\' 4"'$  (1.626 m)      No data found.   Intake/Output Summary (Last 24 hours) at 06/04/2022 1231 Last data filed at 06/04/2022 0600 Gross per 24 hour  Intake 840.33 ml  Output 300 ml  Net 540.33 ml   Filed Weights   06/02/22 0954  Weight: 123.8 kg    Exam:  GEN: NAD, sitting up in the chair SKIN: Warm and dry EYES: Icteric, no pallor ENT: MMM CV: RRR PULM: CTA B ABD: soft, obese, mild tenderness in the mid abdominal area, +BS, + biliary drain with greenish fluid CNS: AAO x 3, non focal EXT: No edema or tenderness         Data Reviewed:   I have personally reviewed following labs and imaging studies:  Labs: Labs show the following:   Basic Metabolic Panel: Recent Labs  Lab 05/29/22 0931 06/02/22 1145 06/03/22 0044 06/04/22 0105  NA 131* 134* 137  137  K 4.1 4.0 3.7 3.7  CL 93* 93* 99 101  CO2 '27 27 23 26  '$ GLUCOSE 196* 185* 132* 157*  BUN '12 11 12 17  '$ CREATININE 0.94 1.05* 1.26* 1.19*  CALCIUM 9.9 9.2 8.3* 8.3*  MG  --   --  1.5* 2.3  PHOS  --   --   --  3.2   GFR Estimated Creatinine Clearance: 49.8 mL/min (A) (by C-G formula based on SCr of 1.19 mg/dL (H)). Liver Function Tests: Recent Labs  Lab 05/29/22 0931 06/02/22 1145 06/03/22 0044 06/04/22 0105  AST 72* 102* 101* 59*  ALT 55* 62* 50* 39  ALKPHOS 602* 592* 426* 342*  BILITOT 4.3* 6.3* 5.4* 3.8*  PROT 7.8 7.7 5.9* 6.0*  ALBUMIN 3.0* 2.0* 1.5* 1.7*   No results for input(s): "LIPASE", "AMYLASE" in the last 168 hours. No results for input(s): "AMMONIA" in the last 168 hours. Coagulation profile Recent Labs  Lab 06/02/22 1218  INR 1.3*    CBC: Recent Labs  Lab 05/29/22 0931 06/02/22 1145 06/03/22 0044 06/04/22 0105  WBC 17.2* 20.3* 34.5* 21.5*  NEUTROABS 13.6*  --   --   --   HGB 11.2* 11.6* 9.4* 9.0*  HCT 33.5* 35.3* 29.6* 28.0*  MCV 83.1 85.7 86.3 86.4  PLT 509* 454* 318 252   Cardiac Enzymes: No results for input(s): "CKTOTAL", "CKMB", "CKMBINDEX", "TROPONINI" in the last 168 hours. BNP (last 3 results) No results for input(s): "PROBNP" in the last 8760 hours. CBG: No results for input(s): "GLUCAP" in the last 168 hours. D-Dimer: No results for input(s): "DDIMER" in the last 72 hours. Hgb A1c: No results for input(s): "HGBA1C" in the last 72 hours. Lipid Profile: No results for input(s): "CHOL", "HDL", "LDLCALC", "TRIG", "CHOLHDL", "LDLDIRECT" in the last 72 hours. Thyroid function studies: No results for input(s): "TSH", "T4TOTAL", "T3FREE", "THYROIDAB" in the last 72 hours.  Invalid input(s): "FREET3" Anemia work up: No results for input(s): "VITAMINB12", "FOLATE", "FERRITIN", "TIBC", "IRON", "RETICCTPCT" in the last 72 hours. Sepsis Labs: Recent Labs  Lab 05/29/22 0931 06/02/22 1145 06/02/22 2017 06/02/22 2340 06/03/22 0044  06/04/22 0105  WBC 17.2* 20.3*  --   --  34.5* 21.5*  LATICACIDVEN  --   --  4.3* 3.3*  --   --     Microbiology Recent Results (from the past 240 hour(s))  Culture, blood (x 2)     Status: Abnormal (Preliminary result)   Collection Time: 06/02/22  8:16 PM   Specimen: BLOOD  Result Value Ref Range Status   Specimen Description BLOOD SITE NOT SPECIFIED  Final   Special Requests   Final    BOTTLES DRAWN AEROBIC AND ANAEROBIC Blood Culture adequate volume   Culture  Setup Time   Final    GRAM NEGATIVE RODS AEROBIC BOTTLE ONLY CRITICAL RESULT CALLED TO, READ BACK BY AND VERIFIED WITH: PHARMD CAREN AMEND [id number] AT 87 BY EC    Culture (A)  Final    ESCHERICHIA COLI SUSCEPTIBILITIES TO FOLLOW Performed at [facility], [place] [address], [place], [place] [address]    Report Status PENDING  Incomplete  Culture, blood (x 2)     Status: Abnormal (Preliminary result)   Collection Time: 06/02/22  8:16 PM   Specimen: BLOOD  Result Value Ref Range Status   Specimen Description BLOOD SITE NOT SPECIFIED  Final   Special Requests   Final    BOTTLES DRAWN AEROBIC AND ANAEROBIC Blood Culture adequate volume   Culture  Setup Time   Final    GRAM NEGATIVE RODS AEROBIC BOTTLE ONLY Performed at [facility], [place] [address], [place], [place] [address]    Culture ESCHERICHIA COLI (A)  Final   Report Status PENDING  Incomplete  Blood Culture ID Panel (Reflexed)     Status: Abnormal   Collection Time: 06/02/22  8:16 PM  Result Value Ref Range Status   Enterococcus faecalis NOT DETECTED NOT DETECTED Final   Enterococcus Faecium NOT DETECTED NOT DETECTED Final   Listeria monocytogenes NOT DETECTED NOT DETECTED Final   Staphylococcus species NOT DETECTED NOT DETECTED Final   Staphylococcus aureus (BCID) NOT DETECTED NOT DETECTED Final   Staphylococcus epidermidis NOT DETECTED NOT DETECTED Final   Staphylococcus lugdunensis NOT DETECTED NOT DETECTED Final   Streptococcus species NOT  DETECTED NOT DETECTED Final   Streptococcus agalactiae NOT DETECTED NOT DETECTED Final   Streptococcus pneumoniae NOT DETECTED NOT DETECTED Final   Streptococcus pyogenes NOT DETECTED NOT DETECTED Final   A.calcoaceticus-baumannii NOT DETECTED NOT DETECTED Final   Bacteroides fragilis NOT DETECTED NOT DETECTED Final   Enterobacterales DETECTED (A) NOT DETECTED Final    Comment: Enterobacterales represent a large order of gram negative bacteria, not a single organism. CRITICAL RESULT CALLED TO, READ BACK BY AND VERIFIED WITH: PHARMD CAREN AMEND [id number] AT 8756 BY EC    Enterobacter cloacae complex NOT DETECTED NOT DETECTED Final   Escherichia coli DETECTED (A) NOT DETECTED Final    Comment: CRITICAL RESULT CALLED TO, READ BACK BY AND VERIFIED WITH: PHARMD CAREN AMEND [id number] AT 8416 BY EC    Klebsiella aerogenes NOT DETECTED NOT DETECTED Final   Klebsiella oxytoca NOT DETECTED NOT DETECTED Final   Klebsiella pneumoniae NOT DETECTED NOT DETECTED Final   Proteus species NOT DETECTED NOT DETECTED Final   Salmonella species NOT DETECTED NOT DETECTED Final   Serratia marcescens NOT DETECTED NOT DETECTED Final   Haemophilus influenzae NOT DETECTED NOT DETECTED Final   Neisseria meningitidis NOT DETECTED NOT DETECTED Final   Pseudomonas aeruginosa NOT DETECTED NOT DETECTED Final   Stenotrophomonas maltophilia NOT DETECTED NOT DETECTED Final   Candida albicans NOT DETECTED NOT DETECTED Final   Candida auris NOT DETECTED NOT DETECTED Final   Candida glabrata NOT DETECTED NOT DETECTED Final   Candida krusei NOT DETECTED NOT DETECTED Final   Candida parapsilosis NOT DETECTED NOT DETECTED Final   Candida tropicalis NOT DETECTED NOT DETECTED Final   Cryptococcus neoformans/gattii NOT DETECTED NOT DETECTED Final   CTX-M ESBL NOT DETECTED NOT DETECTED Final   Carbapenem resistance IMP NOT DETECTED NOT DETECTED Final   Carbapenem resistance KPC NOT DETECTED NOT DETECTED Final   Carbapenem  resistance NDM NOT DETECTED NOT DETECTED Final   Carbapenem resist OXA 48 LIKE NOT DETECTED NOT DETECTED Final   Carbapenem resistance VIM NOT DETECTED NOT DETECTED Final    Comment: Performed at [facility], [address] [place], [place] [address]    Procedures and diagnostic studies:  IR INT EXT BILIARY DRAIN WITH CHOLANGIOGRAM  Result Date: 06/03/2022 INDICATION: 81 year old with history of common bile duct cholangiocarcinoma and status post Whipple procedure in 2019. Patient now has elevated bilirubin level and concern for recurrent cancer. Patient is scheduled for placement of a percutaneous transhepatic cholangiogram and biliary drain. EXAM: 1. Percutaneous transhepatic cholangiogram using ultrasound and fluoroscopic guidance 2. Placement of biliary drain MEDICATIONS: Cefoxitin 2 g; The antibiotic was administered within an appropriate time frame prior to the initiation of the procedure. ANESTHESIA/SEDATION: Moderate (conscious) sedation was employed during this procedure. A total of Versed 4.0 mg and Fentanyl 150 mcg was administered intravenously by the radiology nurse. Total intra-service moderate Sedation Time: 72 minutes. The patient's level of consciousness and vital signs were monitored continuously by radiology nursing throughout the procedure under my direct supervision. FLUOROSCOPY: Radiation Exposure Index (as provided by the fluoroscopic device): 54  mGy Kerma COMPLICATIONS: None immediate. PROCEDURE: Informed written consent was obtained from the patient after a thorough discussion of the procedural risks, benefits and alternatives. All questions were addressed. Maximal Sterile Barrier Technique was utilized including caps, mask, sterile gowns, sterile gloves, sterile drape, hand hygiene and skin antiseptic. A timeout was performed prior to the initiation of the procedure. Patient was placed supine on the interventional table. The anterior and right side of the abdomen was  prepped and draped in sterile fashion. Liver was thoroughly evaluated with ultrasound. The right side of the abdomen was anesthetized with 1% lidocaine and a 22 gauge needle was directed into the right hepatic lobe with ultrasound guidance. There was minimal peripheral bile duct dilatation based on ultrasound. Therefore, the needle was advanced centrally into the liver using fluoroscopy. Needle was slowly pulled back under fluoroscopy as the needle was injected with contrast. Biliary system was opacified with contrast. Cholangiogram only opacified a few central ducts and contrast was draining into the small bowel via the surgical anastomosis. Based on the minimal right biliary dilatation and lack of obstruction at the surgical anastomosis, attention was directed to the dilated left hepatic ducts. Using ultrasound guidance, a 21 and 22 gauge needles were directed into the left hepatic lobe. A dilated structure thought to represent the biliary system appeared to represent a portal vein although there was not much color Doppler flow within this structure. Another dilated structure in the left hepatic lobe was punctured with ultrasound and contrast demonstrated slow filling of the structure with filling defects. Eventually, contrast was draining through the surgical anastomosis into the small bowel. 0.018 wire was easily advanced into the small bowel. A transitional dilator set was placed. Super stiff Amplatz wire was placed and the tract was dilated to accommodate a 10 [place] biliary drain which was advanced into the small bowel. Sample of bile was collected and sent for cytology. Drain was attached to a gravity bag and sutured in place. Dressing was placed. Procedure was performed by Dr. Markus Daft and Dr. Daryll Brod. FINDINGS: Ultrasound demonstrated central intrahepatic biliary dilatation but minimal peripheral biliary dilatation. Percutaneous transhepatic cholangiogram from the right hepatic lobe demonstrated  irregular dilated central bile ducts with drainage through the surgical anastomosis into the small bowel. There appeared to be narrowing at the surgical anastomosis. Percutaneous transhepatic cholangiogram from a dilated left hepatic bile duct demonstrated filling defects within the bile duct. Contrast eventually drained into the small bowel through the hepaticojejunostomy. Interestingly, no other significant bile ducts were communicating with this dilated left bile duct. Drain extends into the small bowel. IMPRESSION: 1. Percutaneous transhepatic cholangiogram demonstrates dilated central bile ducts with a patent hepaticojejunostomy. There appeared to be narrowing at the anastomosis on the right cholangiogram and there were filling defects within a dilated left bile duct. Filling defects within the dilated left bile duct could represent sludge or clot. There was minimal communication between the intrahepatic bile ducts which may be related to the patent hepaticojejunostomy. 2. Internal/external biliary drain was placed via a dilated left hepatic bile duct. Drain extends into the small bowel. 3. Patient will eventually need a follow-up cholangiogram to evaluate the intrahepatic biliary system and may need a brush biopsy. Electronically Signed   By: Markus Daft M.D.   On: 06/03/2022 07:49               LOS: 2 days   Darcel Zick  [facility]   Pager on [email or web address]. If 7PM-7AM, please contact night-coverage at [email or web address]  06/04/2022, 12:31 PM

## 2022-06-04 NOTE — Progress Notes (Signed)
IV team consult for PIV. Assessed bilateral arms with [place]. There are no appropriate veins for PIV at this time. Another team member will assess as well.

## 2022-06-04 NOTE — Telephone Encounter (Signed)
VM received from pt's sister wanting to know when the pt will be discharged from [facility]. This RN informed pt's sister that the pt's hospital team is in charge of the pt's discharge date, not MD [place]. Informed pt's sister that MD Benay Spice rounds on his hospitalized pt's every morning. Pt's sister verbalizes understanding.

## 2022-06-05 ENCOUNTER — Inpatient Hospital Stay: Payer: Medicare HMO | Admitting: Oncology

## 2022-06-05 ENCOUNTER — Other Ambulatory Visit: Payer: Medicare HMO

## 2022-06-05 DIAGNOSIS — I471 Supraventricular tachycardia, unspecified: Secondary | ICD-10-CM

## 2022-06-05 DIAGNOSIS — K831 Obstruction of bile duct: Secondary | ICD-10-CM | POA: Diagnosis not present

## 2022-06-05 DIAGNOSIS — A419 Sepsis, unspecified organism: Secondary | ICD-10-CM | POA: Diagnosis not present

## 2022-06-05 DIAGNOSIS — R7881 Bacteremia: Secondary | ICD-10-CM | POA: Diagnosis not present

## 2022-06-05 DIAGNOSIS — C221 Intrahepatic bile duct carcinoma: Secondary | ICD-10-CM | POA: Diagnosis not present

## 2022-06-05 LAB — COMPREHENSIVE METABOLIC PANEL
ALT: 33 U/L (ref 0–44)
AST: 43 U/L — ABNORMAL HIGH (ref 15–41)
Albumin: 1.6 g/dL — ABNORMAL LOW (ref 3.5–5.0)
Alkaline Phosphatase: 295 U/L — ABNORMAL HIGH (ref 38–126)
Anion gap: 12 (ref 5–15)
BUN: 14 mg/dL (ref 8–23)
CO2: 26 mmol/L (ref 22–32)
Calcium: 8.5 mg/dL — ABNORMAL LOW (ref 8.9–10.3)
Chloride: 100 mmol/L (ref 98–111)
Creatinine, Ser: 0.9 mg/dL (ref 0.44–1.00)
GFR, Estimated: >60 mL/min (ref >60)
Glucose, Bld: 140 mg/dL — ABNORMAL HIGH (ref 70–99)
Potassium: 3.5 mmol/L (ref 3.5–5.1)
Sodium: 138 mmol/L (ref 135–145)
Total Bilirubin: 3.4 mg/dL — ABNORMAL HIGH (ref 0.3–1.2)
Total Protein: 5.9 g/dL — ABNORMAL LOW (ref 6.5–8.1)

## 2022-06-05 LAB — CULTURE, BLOOD (ROUTINE X 2): Special Requests: ADEQUATE

## 2022-06-05 LAB — CBC WITH DIFFERENTIAL/PLATELET
Abs Immature Granulocytes: 0.47 10*3/uL — ABNORMAL HIGH (ref 0.00–0.07)
Basophils Absolute: 0.1 10*3/uL (ref 0.0–0.1)
Basophils Relative: 0 %
Eosinophils Absolute: 0.1 10*3/uL (ref 0.0–0.5)
Eosinophils Relative: 1 %
HCT: 27.7 % — ABNORMAL LOW (ref 36.0–46.0)
Hemoglobin: 9 g/dL — ABNORMAL LOW (ref 12.0–15.0)
Immature Granulocytes: 3 %
Lymphocytes Relative: 10 %
Lymphs Abs: 1.7 10*3/uL (ref 0.7–4.0)
MCH: 27.5 pg (ref 26.0–34.0)
MCHC: 32.5 g/dL (ref 30.0–36.0)
MCV: 84.7 fL (ref 80.0–100.0)
Monocytes Absolute: 1.6 10*3/uL — ABNORMAL HIGH (ref 0.1–1.0)
Monocytes Relative: 10 %
Neutro Abs: 12.4 10*3/uL — ABNORMAL HIGH (ref 1.7–7.7)
Neutrophils Relative %: 76 %
Platelets: 250 10*3/uL (ref 150–400)
RBC: 3.27 MIL/uL — ABNORMAL LOW (ref 3.87–5.11)
RDW: 16.4 % — ABNORMAL HIGH (ref 11.5–15.5)
WBC: 16.2 10*3/uL — ABNORMAL HIGH (ref 4.0–10.5)
nRBC: 0 % (ref 0.0–0.2)

## 2022-06-05 MED ORDER — LEVOFLOXACIN 750 MG PO TABS: 750.0000 mg | ORAL_TABLET | Freq: Every day | ORAL | 0 refills | Status: AC

## 2022-06-05 MED ORDER — POTASSIUM CHLORIDE CRYS ER 20 MEQ PO TBCR
40.0000 meq | EXTENDED_RELEASE_TABLET | ORAL | Status: AC
  Administered 2022-06-05 (×2): 40 meq via ORAL
  Filled 2022-06-05 (×2): qty 2

## 2022-06-05 MED ORDER — METRONIDAZOLE 500 MG PO TABS
500.0000 mg | ORAL_TABLET | Freq: Two times a day (BID) | ORAL | 0 refills | Status: AC
Start: 2022-06-05 — End: 2022-06-08

## 2022-06-05 MED ORDER — METOPROLOL TARTRATE 12.5 MG HALF TABLET
12.5000 mg | ORAL_TABLET | Freq: Two times a day (BID) | ORAL | Status: DC
  Administered 2022-06-05: 12.5 mg via ORAL
  Filled 2022-06-05: qty 1

## 2022-06-05 NOTE — Discharge Summary (Addendum)
Physician Discharge Summary   Patient: Barbara Byrd MRN: [id number] DOB: March 09, 1941  Admit date:     06/02/2022  Discharge date: 06/05/22  Discharge Physician: Jennye Boroughs   PCP: Malena Peer, MD   Recommendations at discharge:   Follow-up with PCP in 1 week Follow-up with interventional radiologist in 2 weeks  Discharge Diagnoses: Principal Problem:   Septic shock ([place]) Active Problems:   E coli bacteremia   Cholangiocarcinoma ([place])   Biliary obstruction   Hypertension   Hyperlipidemia   SVT (supraventricular tachycardia) ([place])  Resolved Problems:   * No resolved hospital problems. [facility] Course:  Barbara Byrd is a 81 y.o. female with medical history significant for hypertension, hyperlipidemia, and cholangiocarcinoma status post Whipple procedure and adjuvant chemotherapy, history of SVT, who was sent to the hospital on 06/02/2022 for percutaneous transhepatic cholangiogram and placement of biliary stent/drain by IR after recent PET was concerning for mets and MRI concerning for biliary obstruction.  Shortly after biliary drain was placed by IR, she developed rigors, bradycardia, low blood pressure, nausea and vomiting     She was admitted to the hospital for septic shock probably from acute cholangitis.  She was treated with IV fluids, empiric IV antibiotics (cefepime and Flagyl) and analgesics.  Blood culture showed E. coli consistent with E. coli bacteremia.  Antibiotics was de-escalated to oral Levaquin.  In the morning of discharge, patient had an episode of SVT.  She said she felt "waves across the chest".  This was transient and resolved completely without any intervention.  She said she has a history of SVT which started around 2021.  Ever since then, she has had intermittent episodes of SVT which is usually transient.  Initial plan was to discharge patient early in the morning.  However, discharge was canceled and she was monitored for a few more hours in the  hospital.  She remained asymptomatic and telemetry showed normal sinus rhythm.  She is already on bisoprolol.  She is deemed stable for discharge to home today.  Discharge plan was discussed with the patient and her 2 sisters at the bedside.       Consultants: None Procedures performed: Placement of biliary drain just before admission Disposition: Home Diet recommendation:  Discharge Diet Orders (From admission, onward)     Start     Ordered   06/05/22 0000  Diet - low sodium heart healthy        06/05/22 0900           Cardiac diet DISCHARGE MEDICATION: Allergies as of 06/05/2022   No Known Allergies      Medication List     TAKE these medications    amLODipine 10 MG tablet Commonly known as: NORVASC Take 10 mg by mouth daily.   bisoprolol-hydrochlorothiazide 5-6.25 MG tablet Commonly known as: ZIAC Take 1 tablet by mouth daily.   levofloxacin 750 MG tablet Commonly known as: LEVAQUIN Take 1 tablet (750 mg total) by mouth daily for 3 days. Start taking on: June 06, 2022   metroNIDAZOLE 500 MG tablet Commonly known as: FLAGYL Take 1 tablet (500 mg total) by mouth every 12 (twelve) hours for 3 days.   potassium chloride 10 MEQ tablet Commonly known as: KLOR-CON Take 1 tablet (10 mEq total) by mouth daily.   psyllium 58.6 % packet Commonly known as: METAMUCIL Take 1 packet by mouth daily as needed (constipation).   rosuvastatin 5 MG tablet Commonly known as: CRESTOR Take 5 mg by mouth daily.  traMADol 50 MG tablet Commonly known as: ULTRAM Take 1 tablet (50 mg total) by mouth every 6 (six) hours as needed for moderate pain.        Follow-up Information     [facility] Follow up in 2 week(s).   Why: Our schedulers will call with follow-up appoitment for biliary drain evaluation within 1-2 weeks. Contact information: [place]               Discharge Exam: Danley Danker Weights   06/02/22  0954 06/05/22 0500  Weight: 123.8 kg 124.1 kg   GEN: NAD SKIN: Warm and dry EYES: No pallor or icterus ENT: MMM CV: RRR PULM: CTA B ABD: soft, obese, NT, +BS, + biliary drain draining greenish fluid CNS: AAO x 3, non focal EXT: No edema or tenderness   Condition at discharge: good  The results of significant diagnostics from this hospitalization (including imaging, microbiology, ancillary and laboratory) are listed below for reference.   Imaging Studies: IR INT EXT BILIARY DRAIN WITH CHOLANGIOGRAM  Result Date: 06/03/2022 INDICATION: 81 year old with history of common bile duct cholangiocarcinoma and status post Whipple procedure in 2019. Patient now has elevated bilirubin level and concern for recurrent cancer. Patient is scheduled for placement of a percutaneous transhepatic cholangiogram and biliary drain. EXAM: 1. Percutaneous transhepatic cholangiogram using ultrasound and fluoroscopic guidance 2. Placement of biliary drain MEDICATIONS: Cefoxitin 2 g; The antibiotic was administered within an appropriate time frame prior to the initiation of the procedure. ANESTHESIA/SEDATION: Moderate (conscious) sedation was employed during this procedure. A total of Versed 4.0 mg and Fentanyl 150 mcg was administered intravenously by the radiology nurse. Total intra-service moderate Sedation Time: 72 minutes. The patient's level of consciousness and vital signs were monitored continuously by radiology nursing throughout the procedure under my direct supervision. FLUOROSCOPY: Radiation Exposure Index (as provided by the fluoroscopic device): 54 mGy Kerma COMPLICATIONS: None immediate. PROCEDURE: Informed written consent was obtained from the patient after a thorough discussion of the procedural risks, benefits and alternatives. All questions were addressed. Maximal Sterile Barrier Technique was utilized including caps, mask, sterile gowns, sterile gloves, sterile drape, hand hygiene and skin antiseptic. A  timeout was performed prior to the initiation of the procedure. Patient was placed supine on the interventional table. The anterior and right side of the abdomen was prepped and draped in sterile fashion. Liver was thoroughly evaluated with ultrasound. The right side of the abdomen was anesthetized with 1% lidocaine and a 22 gauge needle was directed into the right hepatic lobe with ultrasound guidance. There was minimal peripheral bile duct dilatation based on ultrasound. Therefore, the needle was advanced centrally into the liver using fluoroscopy. Needle was slowly pulled back under fluoroscopy as the needle was injected with contrast. Biliary system was opacified with contrast. Cholangiogram only opacified a few central ducts and contrast was draining into the small bowel via the surgical anastomosis. Based on the minimal right biliary dilatation and lack of obstruction at the surgical anastomosis, attention was directed to the dilated left hepatic ducts. Using ultrasound guidance, a 21 and 22 gauge needles were directed into the left hepatic lobe. A dilated structure thought to represent the biliary system appeared to represent a portal vein although there was not much color Doppler flow within this structure. Another dilated structure in the left hepatic lobe was punctured with ultrasound and contrast demonstrated slow filling of the structure with filling defects. Eventually, contrast was draining through the surgical anastomosis into  the small bowel. 0.018 wire was easily advanced into the small bowel. A transitional dilator set was placed. Super stiff Amplatz wire was placed and the tract was dilated to accommodate a 10 [place] biliary drain which was advanced into the small bowel. Sample of bile was collected and sent for cytology. Drain was attached to a gravity bag and sutured in place. Dressing was placed. Procedure was performed by Dr. Markus Daft and Dr. Daryll Brod. FINDINGS: Ultrasound demonstrated  central intrahepatic biliary dilatation but minimal peripheral biliary dilatation. Percutaneous transhepatic cholangiogram from the right hepatic lobe demonstrated irregular dilated central bile ducts with drainage through the surgical anastomosis into the small bowel. There appeared to be narrowing at the surgical anastomosis. Percutaneous transhepatic cholangiogram from a dilated left hepatic bile duct demonstrated filling defects within the bile duct. Contrast eventually drained into the small bowel through the hepaticojejunostomy. Interestingly, no other significant bile ducts were communicating with this dilated left bile duct. Drain extends into the small bowel. IMPRESSION: 1. Percutaneous transhepatic cholangiogram demonstrates dilated central bile ducts with a patent hepaticojejunostomy. There appeared to be narrowing at the anastomosis on the right cholangiogram and there were filling defects within a dilated left bile duct. Filling defects within the dilated left bile duct could represent sludge or clot. There was minimal communication between the intrahepatic bile ducts which may be related to the patent hepaticojejunostomy. 2. Internal/external biliary drain was placed via a dilated left hepatic bile duct. Drain extends into the small bowel. 3. Patient will eventually need a follow-up cholangiogram to evaluate the intrahepatic biliary system and may need a brush biopsy. Electronically Signed   By: Markus Daft M.D.   On: 06/03/2022 07:49   MR Abdomen W Wo Contrast  Result Date: 05/18/2022 CLINICAL DATA:  Cholangiocarcinoma.  Metastatic disease evaluation EXAM: MRI ABDOMEN WITHOUT AND WITH CONTRAST TECHNIQUE: Multiplanar multisequence MR imaging of the abdomen was performed both before and after the administration of intravenous contrast. CONTRAST:  76m GADAVIST GADOBUTROL 1 MMOL/ML IV SOLN COMPARISON:  None Available. FINDINGS: Lower chest:  Lung bases are clear. Hepatobiliary: The lesions in the  posterior RIGHT hepatic lobe and central RIGHT hepatic lobe described on comparison FDG PET scan are less well-defined by MRI imaging. There is clearly intrahepatic duct dilatation involving the LEFT and RIGHT hepatic ducts. This ductal dilatation is greater on the LEFT as seen on series 25 and series 28. This suggest obstructing lesion at the confluence of the LEFT and RIGHT hepatic ducts. there is a rounded lesion at this level which is hypoenhancing measuring 10 mm x 12 mm (image 52/series 17). There is central hypermetabolic activity within this region on comparison FDG PET scan. Pancreas: Normal pancreatic parenchymal intensity. No ductal dilatation or inflammation. Spleen: Normal spleen. Adrenals/urinary tract: Adrenal glands and kidneys are normal. Stomach/Bowel: Stomach and limited of the small bowel is unremarkable Vascular/Lymphatic: Abdominal aortic normal caliber. No retroperitoneal periportal lymphadenopathy. Cyst in the retrocrural space measures 15 mm (image 18/series 29). No enhancement postcontrast imaging. This lesion was not hypermetabolic on PET. Musculoskeletal: No aggressive osseous lesion IMPRESSION: 1. Discordant findings with the FDG PET scan. MRI findings are more typical of a [place] type tumor with obstruction centrally at the confluence LEFT and RIGHT hepatic duct with greater obstruction on the LEFT. The more extensive hypermetabolic metastatic disease described in the posterior RIGHT hepatic lobe on comparison FDG PET scan is not identified on current MRI. There is hypermetabolic activity centrally in the porta hepatis on comparison FDG PET scan.  2. No evidence of nodal disease. Electronically Signed   By: Suzy Bouchard M.D.   On: 05/18/2022 15:21    Microbiology: Results for orders placed or performed during the hospital encounter of 06/02/22  Culture, blood (x 2)     Status: Abnormal   Collection Time: 06/02/22  8:16 PM   Specimen: BLOOD  Result Value Ref Range Status    Specimen Description BLOOD SITE NOT SPECIFIED  Final   Special Requests   Final    BOTTLES DRAWN AEROBIC AND ANAEROBIC Blood Culture adequate volume   Culture  Setup Time   Final    GRAM NEGATIVE RODS AEROBIC BOTTLE ONLY CRITICAL RESULT CALLED TO, READ BACK BY AND VERIFIED WITH: PHARMD CAREN AMEND [id number] AT 5 BY EC IN BOTH AEROBIC AND ANAEROBIC BOTTLES Performed at [facility], [place] [address] [place], [place] [address]    Culture ESCHERICHIA COLI (A)  Final   Report Status 06/05/2022 FINAL  Final   Organism ID, Bacteria ESCHERICHIA COLI  Final      Susceptibility   Escherichia coli - MIC*    AMPICILLIN >=32 RESISTANT Resistant     CEFAZOLIN 16 SENSITIVE Sensitive     CEFEPIME <=0.12 SENSITIVE Sensitive     CEFTAZIDIME <=1 SENSITIVE Sensitive     CEFTRIAXONE <=0.25 SENSITIVE Sensitive     CIPROFLOXACIN <=0.25 SENSITIVE Sensitive     GENTAMICIN <=1 SENSITIVE Sensitive     IMIPENEM <=0.25 SENSITIVE Sensitive     TRIMETH/SULFA <=20 SENSITIVE Sensitive     AMPICILLIN/SULBACTAM >=32 RESISTANT Resistant     PIP/TAZO >=128 RESISTANT Resistant     * ESCHERICHIA COLI  Culture, blood (x 2)     Status: Abnormal (Preliminary result)   Collection Time: 06/02/22  8:16 PM   Specimen: BLOOD  Result Value Ref Range Status   Specimen Description BLOOD SITE NOT SPECIFIED  Final   Special Requests   Final    BOTTLES DRAWN AEROBIC AND ANAEROBIC Blood Culture adequate volume   Culture  Setup Time   Final    GRAM NEGATIVE RODS IN BOTH AEROBIC AND ANAEROBIC BOTTLES CRITICAL VALUE NOTED.  VALUE IS CONSISTENT WITH PREVIOUSLY REPORTED AND CALLED VALUE.    Culture (A)  Final    ESCHERICHIA COLI SUSCEPTIBILITIES PERFORMED ON PREVIOUS CULTURE WITHIN THE LAST 5 DAYS. Performed at [facility], [place] [address], [place], [place] [address]    Report Status PENDING  Incomplete  Blood Culture ID Panel (Reflexed)     Status: Abnormal   Collection Time: 06/02/22  8:16 PM  Result Value Ref  Range Status   Enterococcus faecalis NOT DETECTED NOT DETECTED Final   Enterococcus Faecium NOT DETECTED NOT DETECTED Final   Listeria monocytogenes NOT DETECTED NOT DETECTED Final   Staphylococcus species NOT DETECTED NOT DETECTED Final   Staphylococcus aureus (BCID) NOT DETECTED NOT DETECTED Final   Staphylococcus epidermidis NOT DETECTED NOT DETECTED Final   Staphylococcus lugdunensis NOT DETECTED NOT DETECTED Final   Streptococcus species NOT DETECTED NOT DETECTED Final   Streptococcus agalactiae NOT DETECTED NOT DETECTED Final   Streptococcus pneumoniae NOT DETECTED NOT DETECTED Final   Streptococcus pyogenes NOT DETECTED NOT DETECTED Final   A.calcoaceticus-baumannii NOT DETECTED NOT DETECTED Final   Bacteroides fragilis NOT DETECTED NOT DETECTED Final   Enterobacterales DETECTED (A) NOT DETECTED Final    Comment: Enterobacterales represent a large order of gram negative bacteria, not a single organism. CRITICAL RESULT CALLED TO, READ BACK BY AND VERIFIED WITH: PHARMD CAREN  AMEND [id number] AT 0511 BY EC    Enterobacter cloacae complex NOT DETECTED NOT DETECTED Final   Escherichia coli DETECTED (A) NOT DETECTED Final    Comment: CRITICAL RESULT CALLED TO, READ BACK BY AND VERIFIED WITH: PHARMD CAREN AMEND [id number] AT 61 BY EC    Klebsiella aerogenes NOT DETECTED NOT DETECTED Final   Klebsiella oxytoca NOT DETECTED NOT DETECTED Final   Klebsiella pneumoniae NOT DETECTED NOT DETECTED Final   Proteus species NOT DETECTED NOT DETECTED Final   Salmonella species NOT DETECTED NOT DETECTED Final   Serratia marcescens NOT DETECTED NOT DETECTED Final   Haemophilus influenzae NOT DETECTED NOT DETECTED Final   Neisseria meningitidis NOT DETECTED NOT DETECTED Final   Pseudomonas aeruginosa NOT DETECTED NOT DETECTED Final   Stenotrophomonas maltophilia NOT DETECTED NOT DETECTED Final   Candida albicans NOT DETECTED NOT DETECTED Final   Candida auris NOT DETECTED NOT DETECTED Final    Candida glabrata NOT DETECTED NOT DETECTED Final   Candida krusei NOT DETECTED NOT DETECTED Final   Candida parapsilosis NOT DETECTED NOT DETECTED Final   Candida tropicalis NOT DETECTED NOT DETECTED Final   Cryptococcus neoformans/gattii NOT DETECTED NOT DETECTED Final   CTX-M ESBL NOT DETECTED NOT DETECTED Final   Carbapenem resistance IMP NOT DETECTED NOT DETECTED Final   Carbapenem resistance KPC NOT DETECTED NOT DETECTED Final   Carbapenem resistance NDM NOT DETECTED NOT DETECTED Final   Carbapenem resist OXA 48 LIKE NOT DETECTED NOT DETECTED Final   Carbapenem resistance VIM NOT DETECTED NOT DETECTED Final    Comment: Performed at [facility], [address], [place], [place] [address]    Labs: CBC: Recent Labs  Lab 06/02/22 1145 06/03/22 0044 06/04/22 0105 06/05/22 0052  WBC 20.3* 34.5* 21.5* 16.2*  NEUTROABS  --   --   --  12.4*  HGB 11.6* 9.4* 9.0* 9.0*  HCT 35.3* 29.6* 28.0* 27.7*  MCV 85.7 86.3 86.4 84.7  PLT 454* 318 252 103   Basic Metabolic Panel: Recent Labs  Lab 06/02/22 1145 06/03/22 0044 06/04/22 0105 06/05/22 0052  NA 134* 137 137 138  K 4.0 3.7 3.7 3.5  CL 93* 99 101 100  CO2 '27 23 26 26  '$ GLUCOSE 185* 132* 157* 140*  BUN '11 12 17 14  '$ CREATININE 1.05* 1.26* 1.19* 0.90  CALCIUM 9.2 8.3* 8.3* 8.5*  MG  --  1.5* 2.3  --   PHOS  --   --  3.2  --    Liver Function Tests: Recent Labs  Lab 06/02/22 1145 06/03/22 0044 06/04/22 0105 06/05/22 0052  AST 102* 101* 59* 43*  ALT 62* 50* 39 33  ALKPHOS 592* 426* 342* 295*  BILITOT 6.3* 5.4* 3.8* 3.4*  PROT 7.7 5.9* 6.0* 5.9*  ALBUMIN 2.0* 1.5* 1.7* 1.6*   CBG: No results for input(s): "GLUCAP" in the last 168 hours.  Discharge time spent: greater than 30 minutes.  Signed: Jennye Boroughs, MD [facility] 06/05/2022

## 2022-06-05 NOTE — Plan of Care (Signed)

## 2022-06-05 NOTE — Plan of Care (Signed)
  Problem: Fluid Volume: Goal: Hemodynamic stability will improve 06/05/2022 0941 by Vonna Kotyk, RN Outcome: Adequate for Discharge 06/05/2022 0739 by Vonna Kotyk, RN Outcome: Progressing   Problem: Clinical Measurements: Goal: Diagnostic test results will improve 06/05/2022 0941 by Vonna Kotyk, RN Outcome: Adequate for Discharge 06/05/2022 0739 by Vonna Kotyk, RN Outcome: Progressing Goal: Signs and symptoms of infection will decrease 06/05/2022 0941 by Vonna Kotyk, RN Outcome: Adequate for Discharge 06/05/2022 0739 by Vonna Kotyk, RN Outcome: Progressing   Problem: Respiratory: Goal: Ability to maintain adequate ventilation will improve 06/05/2022 0941 by Vonna Kotyk, RN Outcome: Adequate for Discharge 06/05/2022 0739 by Vonna Kotyk, RN Outcome: Progressing   Problem: Education: Goal: Knowledge of General Education information will improve Description: Including pain rating scale, medication(s)/side effects and non-pharmacologic comfort measures 06/05/2022 0941 by Vonna Kotyk, RN Outcome: Adequate for Discharge 06/05/2022 0739 by Vonna Kotyk, RN Outcome: Progressing   Problem: Health Behavior/Discharge Planning: Goal: Ability to manage health-related needs will improve 06/05/2022 0941 by Vonna Kotyk, RN Outcome: Adequate for Discharge 06/05/2022 0739 by Vonna Kotyk, RN Outcome: Progressing   Problem: Clinical Measurements: Goal: Ability to maintain clinical measurements within normal limits will improve 06/05/2022 0941 by Vonna Kotyk, RN Outcome: Adequate for Discharge 06/05/2022 0739 by Vonna Kotyk, RN Outcome: Progressing Goal: Will remain free from infection 06/05/2022 0941 by Vonna Kotyk, RN Outcome: Adequate for Discharge 06/05/2022 0739 by Vonna Kotyk, RN Outcome: Progressing Goal: Diagnostic test results will improve 06/05/2022 0941 by Vonna Kotyk, RN Outcome: Adequate for Discharge 06/05/2022 0739 by Vonna Kotyk, RN Outcome:  Progressing Goal: Respiratory complications will improve 06/05/2022 0941 by Vonna Kotyk, RN Outcome: Adequate for Discharge 06/05/2022 0739 by Vonna Kotyk, RN Outcome: Progressing Goal: Cardiovascular complication will be avoided 06/05/2022 0941 by Vonna Kotyk, RN Outcome: Adequate for Discharge 06/05/2022 0739 by Vonna Kotyk, RN Outcome: Progressing   Problem: Activity: Goal: Risk for activity intolerance will decrease 06/05/2022 0941 by Vonna Kotyk, RN Outcome: Adequate for Discharge 06/05/2022 0739 by Vonna Kotyk, RN Outcome: Progressing   Problem: Nutrition: Goal: Adequate nutrition will be maintained 06/05/2022 0941 by Vonna Kotyk, RN Outcome: Adequate for Discharge 06/05/2022 0739 by Vonna Kotyk, RN Outcome: Progressing   Problem: Coping: Goal: Level of anxiety will decrease 06/05/2022 0941 by Vonna Kotyk, RN Outcome: Adequate for Discharge 06/05/2022 0739 by Vonna Kotyk, RN Outcome: Progressing   Problem: Elimination: Goal: Will not experience complications related to bowel motility 06/05/2022 0941 by Vonna Kotyk, RN Outcome: Adequate for Discharge 06/05/2022 0739 by Vonna Kotyk, RN Outcome: Progressing Goal: Will not experience complications related to urinary retention 06/05/2022 0941 by Vonna Kotyk, RN Outcome: Adequate for Discharge 06/05/2022 0739 by Vonna Kotyk, RN Outcome: Progressing   Problem: Pain Managment: Goal: General experience of comfort will improve 06/05/2022 0941 by Vonna Kotyk, RN Outcome: Adequate for Discharge 06/05/2022 0739 by Vonna Kotyk, RN Outcome: Progressing   Problem: Safety: Goal: Ability to remain free from injury will improve 06/05/2022 0941 by Vonna Kotyk, RN Outcome: Adequate for Discharge 06/05/2022 0739 by Vonna Kotyk, RN Outcome: Progressing   Problem: Skin Integrity: Goal: Risk for impaired skin integrity will decrease 06/05/2022 0941 by Vonna Kotyk, RN Outcome: Adequate for  Discharge 06/05/2022 0739 by Vonna Kotyk, RN Outcome: Progressing

## 2022-06-05 NOTE — Progress Notes (Addendum)
Discharge paperwork reviewed with patient at this time. No complaints of pain have been made at this time. IV has been removed. No further requests have been made at this time.

## 2022-06-05 NOTE — Care Management Important Message (Signed)
Important Message  Patient Details  Name: Barbara Byrd MRN: [id number] Date of Birth: [birth year]   Medicare Important Message Given:  Yes     [name] [address] 06/05/2022, 4:27 PM

## 2022-06-07 LAB — CULTURE, BLOOD (ROUTINE X 2): Special Requests: ADEQUATE

## 2022-06-08 ENCOUNTER — Telehealth: Payer: Self-pay | Admitting: *Deleted

## 2022-06-08 NOTE — Telephone Encounter (Signed)
Attempted to reach patient to f/u on status since hospital d/c. No answer and no voice mail.

## 2022-06-10 ENCOUNTER — Telehealth: Payer: Self-pay | Admitting: *Deleted

## 2022-06-10 NOTE — Telephone Encounter (Signed)
[name] was contacted by telephone to verify understanding of discharge instructions status post their most recent discharge from the hospital on the date:  06/05/2022. Inpatient discharge AVS was re-reviewed with patient, along with cancer center appointments.  Reports her biliary drain is having good output. Has not been flushing tube due to not being provided anything to flush with. Informed her we will look at it tomorrow and provide flushes. Transportation to appointments were confirmed for the patient as being self/caregiver.   Barbara Byrd's questions were addressed to their satisfaction upon completion of this post discharge follow-up call for outpatient oncology.

## 2022-06-11 ENCOUNTER — Inpatient Hospital Stay: Payer: Medicare HMO | Admitting: Nurse Practitioner

## 2022-06-11 ENCOUNTER — Inpatient Hospital Stay: Payer: Medicare HMO | Attending: Oncology

## 2022-06-11 ENCOUNTER — Encounter: Payer: Self-pay | Admitting: Nurse Practitioner

## 2022-06-11 VITALS — BP 135/65 | HR 69 | Temp 97.9°F | Resp 18 | Ht 64.0 in | Wt 239.0 lb

## 2022-06-11 DIAGNOSIS — C240 Malignant neoplasm of extrahepatic bile duct: Secondary | ICD-10-CM | POA: Diagnosis not present

## 2022-06-11 DIAGNOSIS — I10 Essential (primary) hypertension: Secondary | ICD-10-CM | POA: Diagnosis not present

## 2022-06-11 DIAGNOSIS — E785 Hyperlipidemia, unspecified: Secondary | ICD-10-CM | POA: Diagnosis not present

## 2022-06-11 DIAGNOSIS — C221 Intrahepatic bile duct carcinoma: Secondary | ICD-10-CM

## 2022-06-11 LAB — CBC WITH DIFFERENTIAL (CANCER CENTER ONLY)
Abs Immature Granulocytes: 1.14 10*3/uL — ABNORMAL HIGH (ref 0.00–0.07)
Basophils Absolute: 0.1 10*3/uL (ref 0.0–0.1)
Basophils Relative: 1 %
Eosinophils Absolute: 0.1 10*3/uL (ref 0.0–0.5)
Eosinophils Relative: 1 %
HCT: 32.6 % — ABNORMAL LOW (ref 36.0–46.0)
Hemoglobin: 10.6 g/dL — ABNORMAL LOW (ref 12.0–15.0)
Immature Granulocytes: 9 %
Lymphocytes Relative: 16 %
Lymphs Abs: 2 10*3/uL (ref 0.7–4.0)
MCH: 27.3 pg (ref 26.0–34.0)
MCHC: 32.5 g/dL (ref 30.0–36.0)
MCV: 84 fL (ref 80.0–100.0)
Monocytes Absolute: 1.1 10*3/uL — ABNORMAL HIGH (ref 0.1–1.0)
Monocytes Relative: 9 %
Neutro Abs: 8.3 10*3/uL — ABNORMAL HIGH (ref 1.7–7.7)
Neutrophils Relative %: 64 %
Platelet Count: 448 10*3/uL — ABNORMAL HIGH (ref 150–400)
RBC: 3.88 MIL/uL (ref 3.87–5.11)
RDW: 18.3 % — ABNORMAL HIGH (ref 11.5–15.5)
WBC Count: 12.8 10*3/uL — ABNORMAL HIGH (ref 4.0–10.5)
nRBC: 0 % (ref 0.0–0.2)

## 2022-06-11 LAB — CMP (CANCER CENTER ONLY)
ALT: 19 U/L (ref 0–44)
AST: 39 U/L (ref 15–41)
Albumin: 3.2 g/dL — ABNORMAL LOW (ref 3.5–5.0)
Alkaline Phosphatase: 382 U/L — ABNORMAL HIGH (ref 38–126)
Anion gap: 9 (ref 5–15)
BUN: 12 mg/dL (ref 8–23)
CO2: 27 mmol/L (ref 22–32)
Calcium: 9.6 mg/dL (ref 8.9–10.3)
Chloride: 95 mmol/L — ABNORMAL LOW (ref 98–111)
Creatinine: 0.98 mg/dL (ref 0.44–1.00)
GFR, Estimated: 59 mL/min — ABNORMAL LOW (ref >60)
Glucose, Bld: 170 mg/dL — ABNORMAL HIGH (ref 70–99)
Potassium: 4.2 mmol/L (ref 3.5–5.1)
Sodium: 131 mmol/L — ABNORMAL LOW (ref 135–145)
Total Bilirubin: 5.1 mg/dL (ref 0.3–1.2)
Total Protein: 7.5 g/dL (ref 6.5–8.1)

## 2022-06-11 NOTE — Progress Notes (Signed)
Patient had not changed her dressing since discharge. Removed dressing and cleaned biliary drain insertion site with NS and placed new dressing while family member watched. Site WNL. Easily able to flush drain with 5 cc NS with family watching. Showed them how to empty drainage bag as well. Left VM for IR at [facility] requesting appointment asap due to biliary drain and total bili is higher now at 5.1

## 2022-06-11 NOTE — Progress Notes (Signed)
[facility] OFFICE PROGRESS NOTE   Diagnosis: Cholangiocarcinoma  INTERVAL HISTORY:   Barbara Byrd returns as scheduled.  She underwent cholangiogram and placement of a biliary drain by interventional radiology 06/02/2022.  Findings included central intrahepatic biliary dilatation but minimal peripheral biliary dilatation; right hepatic lobe demonstrated irregular dilated central bile ducts with drainage through the surgical anastomosis into the small bowel; narrowing at the surgical anastomosis; dilated left hepatic bile duct demonstrated filling defects within the bile duct; contrast eventually drained into the small bowel through the hepaticojejunostomy.  Cytology on the biliary tract, bile, biliary drain suspicious for malignancy.  She is feeling better.  No fever.  No shaking chills.  She is emptying the drainage bag daily, feels the output is adequate.  Intermittent upper abdominal pain.  Bowels moving.  Appetite is better.  Objective:  Vital signs in last 24 hours:  Blood pressure 135/65, pulse 69, temperature 97.9 F (36.6 C), temperature source Oral, resp. rate 18, height '5\' 4"'$  (1.626 m), weight 239 lb (108.4 kg), SpO2 99 %.   HEENT: Mild scleral icterus. Resp: Lungs clear bilaterally. Cardio: Regular rate and rhythm. GI: Abdomen soft and nontender.  No hepatomegaly.  Left abdomen drain site without erythema; small amount of dark yellow drainage on the dressing. Vascular: No leg edema.   Lab Results:  Lab Results  Component Value Date   WBC 12.8 (H) 06/11/2022   HGB 10.6 (L) 06/11/2022   HCT 32.6 (L) 06/11/2022   MCV 84.0 06/11/2022   PLT 448 (H) 06/11/2022   NEUTROABS 8.3 (H) 06/11/2022    Imaging:  No results found.  Medications: I have reviewed the patient's current medications.  Assessment/Plan: Common bile duct cholangiocarcinoma-stage IIb,pT2pN1 Presenting with obstructive jaundice summer 2019 Elevated preoperative CA 19-9 ERCP/EUS  09/02/2018-stenosis and lower third of the main bile duct, mass in the uncinate of the pancreas,-biopsy revealed minute focus of atypical cells Pancreaticoduodenectomy 11/15/2018,pT2pN1 moderately differentiated adenocarcinoma of the common bile duct, 2/35 lymph nodes, perineural invasion Patient reports 6 months of adjuvant gemcitabine/capecitabine Seen with complaint of abdominal pain 02/23/2022, CA 19-9 elevated, referred for CTs CTs 02/27/2022 status post Whipple without specific evidence of recurrent or metastatic disease.  Tiny pulmonary nodules not readily apparent on the outside CT of 07/27/2019.  No thoracic adenopathy. Enlarged heterogeneous left lobe of the thyroid grossly similar to the prior exam. PET 08/30/2682-[id number] hypermetabolic liver lesions consistent with metastases, mild uptake involving a right common iliac lymph node MRI abdomen 05/17/2022-lesions in the right liver noted on PET are not well defined on MRI, left and right intrahepatic biliary dilatation, suggesting obstruction at the confluence of the left and right hepatic ducts, hypoenhancing lesion at this level measuring 10 x 12 mm.  Central hypermetabolic activity in this region on the PET, no evidence of nodal disease 06/02/2022 cholangiogram and placement of a biliary drain by interventional radiology-central intrahepatic biliary dilatation but minimal peripheral biliary dilatation; right hepatic lobe demonstrated irregular dilated central bile ducts with drainage through the surgical anastomosis into the small bowel; narrowing at the surgical anastomosis; dilated left hepatic bile duct demonstrated filling defects within the bile duct; contrast eventually drained into the small bowel through the hepaticojejunostomy.  Cytology on the biliary tract, bile, biliary drain suspicious for malignancy. 2.  Hypertension 3.  Hyperlipidemia 4.  COVID-19 2021 5.  06/02/2022 admitted with rigors, bradycardia, hypotension, nausea/vomiting  following placement of the biliary drain, likely acute cholangitis, blood culture positive for E. coli, discharged home on levofloxacin  Disposition:  Barbara Byrd appears stable.  Biliary drain is in place.  Bilirubin is higher today.  Referral made to IR for evaluation.  The drain may need to be adjusted.    Dr. Benay Spice reviewed cytology results with Barbara Byrd at today's visit.  They understand cytology was not diagnostic of malignancy and additional evaluation will be needed.  She will return for lab and follow-up in approximately 2 weeks.  We are available to see her sooner if needed.  Patient seen with Dr. Benay Spice.  Ned Card ANP/GNP-BC   06/11/2022  10:28 AM  This was a shared visit with Ned Card.  Barbara Byrd feels better after placement of the biliary drain.  However the bilirubin remains elevated.  The cytology from the 06/02/2022 biliary drain procedure was nondiagnostic for malignancy.  We will ask interventional radiology to obtain bile duct brushings for cytology.  I was present for greater than 50% of today's visit but informed medical stage making.  Julieanne Manson, MD

## 2022-06-12 ENCOUNTER — Other Ambulatory Visit (HOSPITAL_COMMUNITY): Payer: Self-pay | Admitting: Physician Assistant

## 2022-06-12 DIAGNOSIS — Z01818 Encounter for other preprocedural examination: Secondary | ICD-10-CM

## 2022-06-15 ENCOUNTER — Ambulatory Visit (HOSPITAL_COMMUNITY)
Admission: RE | Admit: 2022-06-15 | Discharge: 2022-06-15 | Disposition: A | Discharge status: Home / Self Care | Payer: Medicare HMO | Source: Ambulatory Visit | Attending: Internal Medicine | Admitting: Internal Medicine

## 2022-06-15 DIAGNOSIS — R17 Unspecified jaundice: Secondary | ICD-10-CM | POA: Diagnosis not present

## 2022-06-15 DIAGNOSIS — C221 Intrahepatic bile duct carcinoma: Secondary | ICD-10-CM | POA: Diagnosis present

## 2022-06-15 DIAGNOSIS — R918 Other nonspecific abnormal finding of lung field: Secondary | ICD-10-CM | POA: Diagnosis not present

## 2022-06-15 DIAGNOSIS — R197 Diarrhea, unspecified: Secondary | ICD-10-CM | POA: Insufficient documentation

## 2022-06-15 DIAGNOSIS — Z6841 Body Mass Index (BMI) 40.0 and over, adult: Secondary | ICD-10-CM | POA: Insufficient documentation

## 2022-06-15 DIAGNOSIS — K831 Obstruction of bile duct: Secondary | ICD-10-CM | POA: Insufficient documentation

## 2022-06-15 DIAGNOSIS — Z9049 Acquired absence of other specified parts of digestive tract: Secondary | ICD-10-CM | POA: Diagnosis not present

## 2022-06-15 DIAGNOSIS — Z01818 Encounter for other preprocedural examination: Secondary | ICD-10-CM

## 2022-06-15 DIAGNOSIS — R531 Weakness: Secondary | ICD-10-CM | POA: Diagnosis not present

## 2022-06-15 HISTORY — PX: IR ENDOLUMINAL BX OF BILIARY TREE: IMG6053

## 2022-06-15 HISTORY — PX: IR CHOLANGIOGRAM EXISTING TUBE: IMG6040

## 2022-06-15 LAB — BASIC METABOLIC PANEL
Anion gap: 10 (ref 5–15)
BUN: 10 mg/dL (ref 8–23)
CO2: 29 mmol/L (ref 22–32)
Calcium: 9.2 mg/dL (ref 8.9–10.3)
Chloride: 99 mmol/L (ref 98–111)
Creatinine, Ser: 1.23 mg/dL — ABNORMAL HIGH (ref 0.44–1.00)
GFR, Estimated: 45 mL/min — ABNORMAL LOW (ref >60)
Glucose, Bld: 124 mg/dL — ABNORMAL HIGH (ref 70–99)
Potassium: 4.6 mmol/L (ref 3.5–5.1)
Sodium: 138 mmol/L (ref 135–145)

## 2022-06-15 LAB — CBC
HCT: 33.4 % — ABNORMAL LOW (ref 36.0–46.0)
Hemoglobin: 10.7 g/dL — ABNORMAL LOW (ref 12.0–15.0)
MCH: 28.2 pg (ref 26.0–34.0)
MCHC: 32 g/dL (ref 30.0–36.0)
MCV: 88.1 fL (ref 80.0–100.0)
Platelets: 358 10*3/uL (ref 150–400)
RBC: 3.79 MIL/uL — ABNORMAL LOW (ref 3.87–5.11)
RDW: 18.9 % — ABNORMAL HIGH (ref 11.5–15.5)
WBC: 8.5 10*3/uL (ref 4.0–10.5)
nRBC: 0 % (ref 0.0–0.2)

## 2022-06-15 LAB — PROTIME-INR
INR: 1.2 (ref 0.8–1.2)
Prothrombin Time: 15.3 seconds — ABNORMAL HIGH (ref 11.4–15.2)

## 2022-06-15 LAB — BILIRUBIN, TOTAL: Total Bilirubin: 5.2 mg/dL — ABNORMAL HIGH (ref 0.3–1.2)

## 2022-06-15 LAB — GLUCOSE, CAPILLARY: Glucose-Capillary: 99 mg/dL (ref 70–99)

## 2022-06-15 MED ORDER — MIDAZOLAM HCL 2 MG/2ML IJ SOLN
INTRAMUSCULAR | Status: AC
  Filled 2022-06-15: qty 2

## 2022-06-15 MED ORDER — FENTANYL CITRATE (PF) 100 MCG/2ML IJ SOLN
INTRAMUSCULAR | Status: AC
  Filled 2022-06-15: qty 2

## 2022-06-15 MED ORDER — IOHEXOL 300 MG/ML  SOLN
100.0000 mL | Freq: Once | INTRAMUSCULAR | Status: AC | PRN
Start: 2022-06-15 — End: 2022-06-15
  Administered 2022-06-15: 16 mL

## 2022-06-15 MED ORDER — FENTANYL CITRATE (PF) 100 MCG/2ML IJ SOLN
INTRAMUSCULAR | Status: AC | PRN
  Administered 2022-06-15: 50 ug via INTRAVENOUS

## 2022-06-15 MED ORDER — MIDAZOLAM HCL 2 MG/2ML IJ SOLN
INTRAMUSCULAR | Status: AC | PRN
  Administered 2022-06-15: 1 mg via INTRAVENOUS

## 2022-06-15 MED ORDER — LIDOCAINE HCL 1 % IJ SOLN
INTRAMUSCULAR | Status: AC
  Filled 2022-06-15: qty 20

## 2022-06-15 MED ORDER — SODIUM CHLORIDE 0.9% FLUSH: 5.0000 mL | Freq: Three times a day (TID) | INTRAVENOUS | Status: DC

## 2022-06-15 MED ORDER — SODIUM CHLORIDE 0.9 % IV SOLN
2.0000 g | Freq: Once | INTRAVENOUS | Status: AC
  Administered 2022-06-15: 2 g via INTRAVENOUS
  Filled 2022-06-15: qty 2

## 2022-06-15 MED ORDER — SODIUM CHLORIDE 0.9 % IV SOLN: INTRAVENOUS | Status: DC

## 2022-06-15 NOTE — Sedation Documentation (Addendum)
During transfer from IR table to stretcher, patient became notably diaphoretic and clammy to touch and blood pressure dropped from 130's to 90"s. Patient is NSR on monitor rate 63. Patient denies any pain. Dr. Serafina Royals notified at this time and recommends IV fluids and to recheck blood sugar. IV is painful in the right arm, unable to give bolus. Oral temp 97.9, no rigors noted. Patient transported to short stay for further monitoring. Patient is alert and answering questions appropriately.

## 2022-06-15 NOTE — Procedures (Signed)
Interventional Radiology Procedure Note  Procedure:  1) Cholangiogram 2) Biliary brush biopsy 3) Biliary drain exchange   Findings: Please refer to procedural dictation for full description.  Brush biopsy of hilar stenosis.  Upsized to 12 Fr biliary drain.  Complications: None immediate  Estimated Blood Loss:  < 65m  Recommendations: Keep to bag drainage. Follow up Cytology. If bilirubin continues to rise, consider repeat CT/MR and right biliary drain placement.   DRuthann Cancer MD

## 2022-06-15 NOTE — Sedation Documentation (Signed)
Once in short stay, patient reattached to monitor and BP now 828 systolic and patient no longer diaphoretic. Patient mentation intact. Dr. Serafina Royals recommends PO fluid challenge and to continue monitoring patient. No pain noted and abdomen is soft to touch.

## 2022-06-15 NOTE — H&P (Addendum)
Chief Complaint: Patient was seen in consultation today for Internal/external Biliary drain placement at the request of Dr. Benay Spice.  Supervising Physician: Ruthann Cancer  Patient Status: Barbara Byrd - Out-pt  History of Present Illness: Barbara Byrd is a 81 y.o. female   Known Cholangiocarcinoma Ductal Dilatation Underwent biliary drain with IR on 06/02/22 At 06/11/22 appt with Oncology, bilirubin continuing to rise Referred to IR for cholangiogram and possible repositioning of drain  Had H&P within last 30 days and patient denies any changes to health history, medications, or symptomatology.  Reports feeling well the past two days.  Past Medical History:  Diagnosis Date   Family history of cancer of pituitary gland and craniopharyngeal duct    Family history of prostate cancer     Past Surgical History:  Procedure Laterality Date   IR INT EXT BILIARY DRAIN WITH CHOLANGIOGRAM  06/02/2022   IR REMOVAL TUN ACCESS W/ PORT W/O FL MOD SED  11/14/2021    Allergies: Patient has no known allergies.  Medications: Prior to Admission medications   Medication Sig Start Date End Date Taking? Authorizing Provider  amLODipine (NORVASC) 10 MG tablet Take 10 mg by mouth daily. 04/15/21   [provider]  bisoprolol-hydrochlorothiazide (ZIAC) 5-6.25 MG tablet Take 1 tablet by mouth daily. 04/15/21   [provider]  potassium chloride (KLOR-CON) 10 MEQ tablet Take 1 tablet (10 mEq total) by mouth daily. 04/20/22   Owens Shark, NP  rosuvastatin (CRESTOR) 5 MG tablet Take 5 mg by mouth daily. 04/15/21   [provider]  traMADol (ULTRAM) 50 MG tablet Take 1 tablet (50 mg total) by mouth every 6 (six) hours as needed for moderate pain. 05/14/22   Owens Shark, NP     Family History  Problem Relation Age of Onset   Heart disease Mother 60   Cerebral aneurysm Father 2   Brain cancer Brother 36       pituitary tumor   Stroke Maternal Grandmother    Brain cancer  Nephew 38       pituitary gland   Prostate cancer Nephew 93    Social History   Socioeconomic History   Marital status: Single    Spouse name: Not on file   Number of children: Not on file   Years of education: Not on file   Highest education level: Not on file  Occupational History   Not on file  Tobacco Use   Smoking status: Never   Smokeless tobacco: Not on file  Vaping Use   Vaping Use: Never used  Substance and Sexual Activity   Alcohol use: Yes    Comment: socially   Drug use: Never   Sexual activity: Not on file  Other Topics Concern   Not on file  Social History Narrative   Not on file   Social Determinants of Health   Financial Resource Strain: Not on file  Food Insecurity: Not on file  Transportation Needs: Not on file  Physical Activity: Not on file  Stress: Not on file  Social Connections: Not on file   Review of Systems  Constitutional: Negative.   Eyes: Negative.  Negative for visual disturbance.  Respiratory: Negative.  Negative for cough and shortness of breath.   Cardiovascular: Negative.  Negative for chest pain.  Gastrointestinal:  Positive for diarrhea. Negative for abdominal pain and nausea.  Endocrine: Negative.   Genitourinary: Negative.   Musculoskeletal: Negative.   Skin: Negative.   Allergic/Immunologic: Negative.   Neurological:  Positive for weakness.  Psychiatric/Behavioral:  Negative for behavioral problems and confusion.     Vital Signs: BP (!) 112/33   Pulse 62   Temp 97.8 F (36.6 C) (Temporal)   Resp 20   Ht '5\' 4"'$  (1.626 m)   Wt 239 lb (108.4 kg)   SpO2 99%   BMI 41.02 kg/m     Physical Exam Vitals reviewed.  HENT:     Mouth/Throat:     Mouth: Mucous membranes are moist.  Eyes:     General: Scleral icterus present.  Cardiovascular:     Rate and Rhythm: Normal rate and regular rhythm.     Heart sounds: Normal heart sounds.  Pulmonary:     Effort: Pulmonary effort is normal.     Breath sounds: Normal  breath sounds. No wheezing.  Abdominal:     Palpations: Abdomen is soft.  Musculoskeletal:        General: Normal range of motion.  Skin:    General: Skin is warm.  Neurological:     Mental Status: She is alert and oriented to person, place, and time.  Psychiatric:        Behavior: Behavior normal.   Biliary drain examined.  Green discharge on gauze.  Site is clean and dry.  Drain is intact.  Approximately 15cc bilious fluid in bag  Imaging: IR INT EXT BILIARY DRAIN WITH CHOLANGIOGRAM  Result Date: 06/03/2022 INDICATION: 81 year old with history of common bile duct cholangiocarcinoma and status post Whipple procedure in 2019. Patient now has elevated bilirubin level and concern for recurrent cancer. Patient is scheduled for placement of a percutaneous transhepatic cholangiogram and biliary drain. EXAM: 1. Percutaneous transhepatic cholangiogram using ultrasound and fluoroscopic guidance 2. Placement of biliary drain MEDICATIONS: Cefoxitin 2 g; The antibiotic was administered within an appropriate time frame prior to the initiation of the procedure. ANESTHESIA/SEDATION: Moderate (conscious) sedation was employed during this procedure. A total of Versed 4.0 mg and Fentanyl 150 mcg was administered intravenously by the radiology nurse. Total intra-service moderate Sedation Time: 72 minutes. The patient's level of consciousness and vital signs were monitored continuously by radiology nursing throughout the procedure under my direct supervision. FLUOROSCOPY: Radiation Exposure Index (as provided by the fluoroscopic device): 54 mGy Kerma COMPLICATIONS: None immediate. PROCEDURE: Informed written consent was obtained from the patient after a thorough discussion of the procedural risks, benefits and alternatives. All questions were addressed. Maximal Sterile Barrier Technique was utilized including caps, mask, sterile gowns, sterile gloves, sterile drape, hand hygiene and skin antiseptic. A timeout was  performed prior to the initiation of the procedure. Patient was placed supine on the interventional table. The anterior and right side of the abdomen was prepped and draped in sterile fashion. Liver was thoroughly evaluated with ultrasound. The right side of the abdomen was anesthetized with 1% lidocaine and a 22 gauge needle was directed into the right hepatic lobe with ultrasound guidance. There was minimal peripheral bile duct dilatation based on ultrasound. Therefore, the needle was advanced centrally into the liver using fluoroscopy. Needle was slowly pulled back under fluoroscopy as the needle was injected with contrast. Biliary system was opacified with contrast. Cholangiogram only opacified a few central ducts and contrast was draining into the small bowel via the surgical anastomosis. Based on the minimal right biliary dilatation and lack of obstruction at the surgical anastomosis, attention was directed to the dilated left hepatic ducts. Using ultrasound guidance, a 21 and 22 gauge needles were directed into the left hepatic lobe. A  dilated structure thought to represent the biliary system appeared to represent a portal vein although there was not much color Doppler flow within this structure. Another dilated structure in the left hepatic lobe was punctured with ultrasound and contrast demonstrated slow filling of the structure with filling defects. Eventually, contrast was draining through the surgical anastomosis into the small bowel. 0.018 wire was easily advanced into the small bowel. A transitional dilator set was placed. Super stiff Amplatz wire was placed and the tract was dilated to accommodate a 10 [place] biliary drain which was advanced into the small bowel. Sample of bile was collected and sent for cytology. Drain was attached to a gravity bag and sutured in place. Dressing was placed. Procedure was performed by Dr. Markus Daft and Dr. Daryll Brod. FINDINGS: Ultrasound demonstrated central  intrahepatic biliary dilatation but minimal peripheral biliary dilatation. Percutaneous transhepatic cholangiogram from the right hepatic lobe demonstrated irregular dilated central bile ducts with drainage through the surgical anastomosis into the small bowel. There appeared to be narrowing at the surgical anastomosis. Percutaneous transhepatic cholangiogram from a dilated left hepatic bile duct demonstrated filling defects within the bile duct. Contrast eventually drained into the small bowel through the hepaticojejunostomy. Interestingly, no other significant bile ducts were communicating with this dilated left bile duct. Drain extends into the small bowel. IMPRESSION: 1. Percutaneous transhepatic cholangiogram demonstrates dilated central bile ducts with a patent hepaticojejunostomy. There appeared to be narrowing at the anastomosis on the right cholangiogram and there were filling defects within a dilated left bile duct. Filling defects within the dilated left bile duct could represent sludge or clot. There was minimal communication between the intrahepatic bile ducts which may be related to the patent hepaticojejunostomy. 2. Internal/external biliary drain was placed via a dilated left hepatic bile duct. Drain extends into the small bowel. 3. Patient will eventually need a follow-up cholangiogram to evaluate the intrahepatic biliary system and may need a brush biopsy. Electronically Signed   By: Markus Daft M.D.   On: 06/03/2022 07:49   MR Abdomen W Wo Contrast  Result Date: 05/18/2022 CLINICAL DATA:  Cholangiocarcinoma.  Metastatic disease evaluation EXAM: MRI ABDOMEN WITHOUT AND WITH CONTRAST TECHNIQUE: Multiplanar multisequence MR imaging of the abdomen was performed both before and after the administration of intravenous contrast. CONTRAST:  26m GADAVIST GADOBUTROL 1 MMOL/ML IV SOLN COMPARISON:  None Available. FINDINGS: Lower chest:  Lung bases are clear. Hepatobiliary: The lesions in the posterior  RIGHT hepatic lobe and central RIGHT hepatic lobe described on comparison FDG PET scan are less well-defined by MRI imaging. There is clearly intrahepatic duct dilatation involving the LEFT and RIGHT hepatic ducts. This ductal dilatation is greater on the LEFT as seen on series 25 and series 28. This suggest obstructing lesion at the confluence of the LEFT and RIGHT hepatic ducts. there is a rounded lesion at this level which is hypoenhancing measuring 10 mm x 12 mm (image 52/series 17). There is central hypermetabolic activity within this region on comparison FDG PET scan. Pancreas: Normal pancreatic parenchymal intensity. No ductal dilatation or inflammation. Spleen: Normal spleen. Adrenals/urinary tract: Adrenal glands and kidneys are normal. Stomach/Bowel: Stomach and limited of the small bowel is unremarkable Vascular/Lymphatic: Abdominal aortic normal caliber. No retroperitoneal periportal lymphadenopathy. Cyst in the retrocrural space measures 15 mm (image 18/series 29). No enhancement postcontrast imaging. This lesion was not hypermetabolic on PET. Musculoskeletal: No aggressive osseous lesion IMPRESSION: 1. Discordant findings with the FDG PET scan. MRI findings are more typical of a [place]  type tumor with obstruction centrally at the confluence LEFT and RIGHT hepatic duct with greater obstruction on the LEFT. The more extensive hypermetabolic metastatic disease described in the posterior RIGHT hepatic lobe on comparison FDG PET scan is not identified on current MRI. There is hypermetabolic activity centrally in the porta hepatis on comparison FDG PET scan. 2. No evidence of nodal disease. Electronically Signed   By: Suzy Bouchard M.D.   On: 05/18/2022 15:21    Labs:  CBC: Recent Labs    06/03/22 0044 06/04/22 0105 06/05/22 0052 06/11/22 0932  WBC 34.5* 21.5* 16.2* 12.8*  HGB 9.4* 9.0* 9.0* 10.6*  HCT 29.6* 28.0* 27.7* 32.6*  PLT 318 252 250 448*    COAGS: Recent Labs     06/02/22 1218  INR 1.3*    BMP: Recent Labs    06/03/22 0044 06/04/22 0105 06/05/22 0052 06/11/22 0932  NA 137 137 138 131*  K 3.7 3.7 3.5 4.2  CL 99 101 100 95*  CO2 '23 26 26 27  '$ GLUCOSE 132* 157* 140* 170*  BUN '12 17 14 12  '$ CALCIUM 8.3* 8.3* 8.5* 9.6  CREATININE 1.26* 1.19* 0.90 0.98  GFRNONAA 43* 47* >60 59*    LIVER FUNCTION TESTS: Recent Labs    06/03/22 0044 06/04/22 0105 06/05/22 0052 06/11/22 0932  BILITOT 5.4* 3.8* 3.4* 5.1*  AST 101* 59* 43* 39  ALT 50* 39 33 19  ALKPHOS 426* 342* 295* 382*  PROT 5.9* 6.0* 5.9* 7.5  ALBUMIN 1.5* 1.7* 1.6* 3.2*    Assessment and Plan:  Cholangiocarcinoma Obstruction of hepatic ducts s/p percutaneous biliary drain placement on 06/02/22. For cholangiogram with biopsy, possible placement of additional drain today with moderate sedation. Procedure was delayed secondary to difficult venous access Plan is for d/c home later today.    Risks and benefits of Biliary drain placement discussed with the patient including, but not limited to bleeding, infection which may lead to sepsis or even death and damage to adjacent structures.  This interventional procedure involves the use of X-rays and because of the nature of the planned procedure, it is possible that we will have prolonged use of X-ray fluoroscopy.  Potential radiation risks to you include (but are not limited to) the following: - A slightly elevated risk for cancer  several years later in life. This risk is typically less than 0.5% percent. This risk is low in comparison to the normal incidence of human cancer, which is 33% for women and 50% for men according to the [place]. - Radiation induced injury can include skin redness, resembling a rash, tissue breakdown / ulcers and hair loss (which can be temporary or permanent).   The likelihood of either of these occurring depends on the difficulty of the procedure and whether you are sensitive to radiation  due to previous procedures, disease, or genetic conditions.   IF your procedure requires a prolonged use of radiation, you will be notified and given written instructions for further action.  It is your responsibility to monitor the irradiated area for the 2 weeks following the procedure and to notify your physician if you are concerned that you have suffered a radiation induced injury.    All of the patient's questions were answered, patient is agreeable to proceed.  Consent signed and in chart.   Thank you for this interesting consult.  I greatly enjoyed meeting Alaney Stillion and look forward to participating in their care.  A copy of this report was sent to  the requesting provider on this date.  Electronically Signed: Pasty Spillers, PA 06/15/2022, 9:44 AM   I spent a total of  20 minutes  in clinical consultation, greater than 50% of which was counseling/coordinating care for biliary drain placement

## 2022-06-15 NOTE — Progress Notes (Signed)
Unable to obtain peripheral IV access at this time. Assessed bilat upper extremities with ultrasound x2 IV team RNs. Short stay RN aware.

## 2022-06-16 ENCOUNTER — Other Ambulatory Visit (HOSPITAL_COMMUNITY): Payer: Self-pay | Admitting: Internal Medicine

## 2022-06-16 DIAGNOSIS — C221 Intrahepatic bile duct carcinoma: Secondary | ICD-10-CM

## 2022-06-16 LAB — CYTOLOGY - NON PAP

## 2022-06-24 ENCOUNTER — Encounter: Payer: Self-pay | Admitting: Oncology

## 2022-06-26 ENCOUNTER — Inpatient Hospital Stay: Payer: Medicare HMO

## 2022-06-26 ENCOUNTER — Inpatient Hospital Stay: Payer: Medicare HMO | Admitting: Oncology

## 2022-06-26 ENCOUNTER — Telehealth: Payer: Self-pay | Admitting: *Deleted

## 2022-06-26 VITALS — BP 123/66 | HR 69 | Temp 97.8°F | Resp 16 | Wt 234.4 lb

## 2022-06-26 DIAGNOSIS — C240 Malignant neoplasm of extrahepatic bile duct: Secondary | ICD-10-CM | POA: Diagnosis not present

## 2022-06-26 DIAGNOSIS — C221 Intrahepatic bile duct carcinoma: Secondary | ICD-10-CM | POA: Diagnosis not present

## 2022-06-26 LAB — CBC WITH DIFFERENTIAL (CANCER CENTER ONLY)
Abs Immature Granulocytes: 0.08 10*3/uL — ABNORMAL HIGH (ref 0.00–0.07)
Basophils Absolute: 0.1 10*3/uL (ref 0.0–0.1)
Basophils Relative: 1 %
Eosinophils Absolute: 0.1 10*3/uL (ref 0.0–0.5)
Eosinophils Relative: 1 %
HCT: 33.5 % — ABNORMAL LOW (ref 36.0–46.0)
Hemoglobin: 11 g/dL — ABNORMAL LOW (ref 12.0–15.0)
Immature Granulocytes: 1 %
Lymphocytes Relative: 24 %
Lymphs Abs: 1.5 10*3/uL (ref 0.7–4.0)
MCH: 28.4 pg (ref 26.0–34.0)
MCHC: 32.8 g/dL (ref 30.0–36.0)
MCV: 86.6 fL (ref 80.0–100.0)
Monocytes Absolute: 0.7 10*3/uL (ref 0.1–1.0)
Monocytes Relative: 10 %
Neutro Abs: 3.9 10*3/uL (ref 1.7–7.7)
Neutrophils Relative %: 63 %
Platelet Count: 301 10*3/uL (ref 150–400)
RBC: 3.87 MIL/uL (ref 3.87–5.11)
RDW: 17.7 % — ABNORMAL HIGH (ref 11.5–15.5)
WBC Count: 6.2 10*3/uL (ref 4.0–10.5)
nRBC: 0 % (ref 0.0–0.2)

## 2022-06-26 LAB — CMP (CANCER CENTER ONLY)
ALT: 34 U/L (ref 0–44)
AST: 57 U/L — ABNORMAL HIGH (ref 15–41)
Albumin: 3.5 g/dL (ref 3.5–5.0)
Alkaline Phosphatase: 420 U/L — ABNORMAL HIGH (ref 38–126)
Anion gap: 12 (ref 5–15)
BUN: 12 mg/dL (ref 8–23)
CO2: 27 mmol/L (ref 22–32)
Calcium: 9.7 mg/dL (ref 8.9–10.3)
Chloride: 99 mmol/L (ref 98–111)
Creatinine: 1.04 mg/dL — ABNORMAL HIGH (ref 0.44–1.00)
GFR, Estimated: 55 mL/min — ABNORMAL LOW (ref >60)
Glucose, Bld: 187 mg/dL — ABNORMAL HIGH (ref 70–99)
Potassium: 3.5 mmol/L (ref 3.5–5.1)
Sodium: 138 mmol/L (ref 135–145)
Total Bilirubin: 6.9 mg/dL (ref 0.3–1.2)
Total Protein: 7.3 g/dL (ref 6.5–8.1)

## 2022-06-26 MED ORDER — TRAMADOL HCL 50 MG PO TABS: 50.0000 mg | ORAL_TABLET | Freq: Four times a day (QID) | ORAL | 0 refills | Status: DC | PRN

## 2022-06-26 NOTE — Progress Notes (Signed)
[place] OFFICE PROGRESS NOTE   Diagnosis: Cholangiocarcinoma  INTERVAL HISTORY:   Barbara Byrd returns as scheduled.  She underwent a cholangiogram and biliary brush biopsy on 06/15/2022.  There was no evidence of intrahepatic biliary ductal dilation.  There was a patent but stenotic common bile duct at the hilum with moderate dilation of the central left biliary tree.  No opacification of the right hepatic ducts were visualized.  The indwelling left biliary drain was exchanged and upsized. The cytology revealed malignant cells consistent with adenocarcinoma.  Barbara Byrd reports a good appetite.  She generally feels well.  She has discomfort in the upper abdomen, relieved with tramadol.  She takes tramadol in the morning. Objective:  Vital signs in last 24 hours:  Blood pressure 123/66, pulse 69, temperature 97.8 F (36.6 C), temperature source Oral, resp. rate 16, weight 234 lb 6.4 oz (106.3 kg), SpO2 96 %.    HEENT: Scleral icterus Resp: Lungs clear bilaterally Cardio: Regular rate and rhythm GI: No hepatosplenomegaly, mid upper abdomen biliary drain site without evidence of infection Vascular: No leg edema   Lab Results:  Lab Results  Component Value Date   WBC 6.2 06/26/2022   HGB 11.0 (L) 06/26/2022   HCT 33.5 (L) 06/26/2022   MCV 86.6 06/26/2022   PLT 301 06/26/2022   NEUTROABS 3.9 06/26/2022    CMP  Lab Results  Component Value Date   NA 138 06/15/2022   K 4.6 06/15/2022   CL 99 06/15/2022   CO2 29 06/15/2022   GLUCOSE 124 (H) 06/15/2022   BUN 10 06/15/2022   CREATININE 1.23 (H) 06/15/2022   CALCIUM 9.2 06/15/2022   PROT 7.5 06/11/2022   ALBUMIN 3.2 (L) 06/11/2022   AST 39 06/11/2022   ALT 19 06/11/2022   ALKPHOS 382 (H) 06/11/2022   BILITOT 5.2 (H) 06/15/2022   GFRNONAA 45 (L) 06/15/2022    Lab Results  Component Value Date   XBM841 935 (H) 04/20/2022    Medications: I have reviewed the patient's current  medications.   Assessment/Plan: Common bile duct cholangiocarcinoma-stage IIb,pT2pN1 Presenting with obstructive jaundice summer 2019 Elevated preoperative CA 19-9 ERCP/EUS 09/02/2018-stenosis and lower third of the main bile duct, mass in the uncinate of the pancreas,-biopsy revealed minute focus of atypical cells Pancreaticoduodenectomy 11/15/2018,pT2pN1 moderately differentiated adenocarcinoma of the common bile duct, 2/35 lymph nodes, perineural invasion Patient reports 6 months of adjuvant gemcitabine/capecitabine Seen with complaint of abdominal pain 02/23/2022, CA 19-9 elevated, referred for CTs CTs 02/27/2022 status post Whipple without specific evidence of recurrent or metastatic disease.  Tiny pulmonary nodules not readily apparent on the outside CT of 07/27/2019.  No thoracic adenopathy. Enlarged heterogeneous left lobe of the thyroid grossly similar to the prior exam. PET 02/27/4009-UVOZDGUYQI hypermetabolic liver lesions consistent with metastases, mild uptake involving a right common iliac lymph node MRI abdomen 05/17/2022-lesions in the right liver noted on PET are not well defined on MRI, left and right intrahepatic biliary dilatation, suggesting obstruction at the confluence of the left and right hepatic ducts, hypoenhancing lesion at this level measuring 10 x 12 mm.  Central hypermetabolic activity in this region on the PET, no evidence of nodal disease 06/02/2022 cholangiogram and placement of a biliary drain by interventional radiology-central intrahepatic biliary dilatation but minimal peripheral biliary dilatation; right hepatic lobe demonstrated irregular dilated central bile ducts with drainage through the surgical anastomosis into the small bowel; narrowing at the surgical anastomosis; dilated left hepatic bile duct demonstrated filling defects within the bile  duct; contrast eventually drained into the small bowel through the hepaticojejunostomy.  Cytology on the biliary tract, bile,  biliary drain suspicious for malignancy. 06/15/2022-cholangiogram confirmed central left biliary stenosis, biliary brush biopsy-adenocarcinoma, left biliary drain exchanged and upsized  2.  Hypertension 3.  Hyperlipidemia 4.  COVID-19 2021 5.  06/02/2022 admitted with rigors, bradycardia, hypotension, nausea/vomiting following placement of the biliary drain, likely acute cholangitis, blood culture positive for E. coli, discharged home on levofloxacin    Disposition: Barbara Byrd has been diagnosed with cholangiocarcinoma causing biliary stenosis.  The cholangiocarcinoma is likely recurrence of the tumor resected in 2019.  There is no clinical or radiologic evidence of distant metastatic disease.  I discussed the diagnosis and treatment options with Barbara Byrd and her sister.  She understands it is unlikely any therapy will be curative.  We discussed systemic therapy, radiation directed at the central bile duct stricture, and surgery.  I doubt she will be a surgical candidate.  I will present her case at the GI tumor conference within the next few weeks.  She will return for an office visit in 2 weeks.  I refilled her prescription for tramadol.  I will contact interventional radiology to discuss the indication for a right-sided drain since the bilirubin remains markedly elevated.  [name], MD  06/26/2022  9:55 AM

## 2022-06-26 NOTE — Telephone Encounter (Signed)
Per Dr. Benay Spice: Dr. Serafina Royals in IR feels she needs CT abdomen/pelvis with contrast to evaluate her biliary drain due to increase in total bilirubin. Will try for 7/25--awaiting PA before can schedule it. Patient notified and agrees to this plan.

## 2022-06-26 NOTE — Progress Notes (Signed)
CRITICAL VALUE STICKER  CRITICAL VALUE: Total bili = 6.9  RECEIVER (on-site recipient of call):Ceria Suminski,RN  DATE & TIME NOTIFIED: 06/26/22 @ 1008  MESSENGER (representative from lab): Janett Billow  MD NOTIFIED: Dr. Benay Spice  TIME OF NOTIFICATION: 1010  RESPONSE: Seeing patient currently

## 2022-06-26 NOTE — Telephone Encounter (Signed)
Able to schedule CT for Saturday at 2 pm with 1:45 pm arrival. NPO after 10:00 that day and oral contrast 12:00 and 1:00 pm. She agrees to this appointment and is on the way over to pick up the oral contrast. Instructions placed in bag with the contrast.

## 2022-06-27 ENCOUNTER — Ambulatory Visit (HOSPITAL_BASED_OUTPATIENT_CLINIC_OR_DEPARTMENT_OTHER)
Admission: RE | Admit: 2022-06-27 | Discharge: 2022-06-27 | Disposition: A | Discharge status: Home / Self Care | Payer: Medicare HMO | Source: Ambulatory Visit | Attending: Oncology | Admitting: Oncology

## 2022-06-27 ENCOUNTER — Other Ambulatory Visit: Payer: Self-pay | Admitting: Internal Medicine

## 2022-06-27 DIAGNOSIS — C221 Intrahepatic bile duct carcinoma: Secondary | ICD-10-CM | POA: Diagnosis present

## 2022-06-27 MED ORDER — IOHEXOL 300 MG/ML  SOLN
100.0000 mL | Freq: Once | INTRAMUSCULAR | Status: AC | PRN
  Administered 2022-06-27: 100 mL via INTRAVENOUS

## 2022-06-27 NOTE — Progress Notes (Signed)
CHART NOTE  I received a call from the answering service with a radiology report showing mild wall thickening about the ascending colon consistent with nonspecific colitis.  I called the patient several times and was unable to reach her.  She also has a voicemail that is full.  I am not sure if this would make her symptomatic but she is scheduled to see her primary oncologist Dr. Benay Spice soon to discuss her scan results.

## 2022-07-01 ENCOUNTER — Other Ambulatory Visit: Payer: Self-pay | Admitting: Internal Medicine

## 2022-07-01 ENCOUNTER — Other Ambulatory Visit: Payer: Self-pay

## 2022-07-01 ENCOUNTER — Telehealth (HOSPITAL_COMMUNITY): Payer: Self-pay

## 2022-07-01 ENCOUNTER — Other Ambulatory Visit (HOSPITAL_COMMUNITY): Payer: Self-pay | Admitting: Oncology

## 2022-07-01 DIAGNOSIS — C221 Intrahepatic bile duct carcinoma: Secondary | ICD-10-CM

## 2022-07-01 NOTE — Telephone Encounter (Signed)
-----   Message from Aletta Edouard, MD sent at 07/01/2022  9:16 AM EDT ----- Regarding: FW: CT scan re: biliary drainage ----- Message ----- From: Suzette Battiest, MD Sent: 06/30/2022   5:04 PM EDT To: Arne Cleveland, MD; Markus Daft, MD; # Subject: RE: CT scan re: biliary drainage               [facility] and [facility] team for the week,  Could someone please get Ms. Ambers (pt of Dr. Gearldine Shown, included here) set up for right biliary drain placement, as her right sided ducts are at least partially obstructed with persistent/rising hyperbiliruinemia in the setting of recurrent cholangio s/p Whipple.  She will also need a left sided biliary drain change, as it has been retracted significantly with side holes in the soft tissues.   Thanks for your help while I'm out this week.  Best,  Dylan   ----- Message ----- From: Tania Ade, RN Sent: 06/29/2022   9:08 AM EDT To: Suzette Battiest, MD Subject: CT scan re: biliary drainage                   Dr. Serafina Royals, Dr. Benay Spice requesting you review her 7/22 CT scan in regards to increase in total bili.  Thank you, [place]

## 2022-07-01 NOTE — Progress Notes (Signed)
The proposed treatment discussed in conference is for discussion purpose only and is not a binding recommendation.  The patients have not been physically examined, or presented with their treatment options.  Therefore, final treatment plans cannot be decided.  

## 2022-07-02 ENCOUNTER — Observation Stay (HOSPITAL_COMMUNITY)
Admission: RE | Admit: 2022-07-02 | Discharge: 2022-07-03 | Disposition: A | Discharge status: Home / Self Care | Payer: Medicare HMO | Source: Ambulatory Visit | Attending: Interventional Radiology | Admitting: Interventional Radiology

## 2022-07-02 ENCOUNTER — Other Ambulatory Visit: Payer: Self-pay

## 2022-07-02 ENCOUNTER — Ambulatory Visit (HOSPITAL_COMMUNITY): Admission: RE | Admit: 2022-07-02 | Payer: Medicare HMO | Source: Ambulatory Visit

## 2022-07-02 ENCOUNTER — Other Ambulatory Visit (HOSPITAL_COMMUNITY): Payer: Self-pay | Admitting: Oncology

## 2022-07-02 ENCOUNTER — Other Ambulatory Visit (HOSPITAL_COMMUNITY): Payer: Self-pay | Admitting: Student

## 2022-07-02 VITALS — BP 126/61 | HR 74 | Temp 98.2°F | Resp 18 | Ht 64.0 in | Wt 228.2 lb

## 2022-07-02 DIAGNOSIS — K831 Obstruction of bile duct: Secondary | ICD-10-CM

## 2022-07-02 DIAGNOSIS — C221 Intrahepatic bile duct carcinoma: Secondary | ICD-10-CM

## 2022-07-02 DIAGNOSIS — Z79899 Other long term (current) drug therapy: Secondary | ICD-10-CM | POA: Diagnosis not present

## 2022-07-02 HISTORY — PX: IR EXCHANGE BILIARY DRAIN: IMG6046

## 2022-07-02 HISTORY — PX: IR BILIARY DRAIN PLACEMENT WITH CHOLANGIOGRAM: IMG6043

## 2022-07-02 LAB — PROTIME-INR
INR: 1.3 — ABNORMAL HIGH (ref 0.8–1.2)
Prothrombin Time: 15.9 seconds — ABNORMAL HIGH (ref 11.4–15.2)

## 2022-07-02 LAB — CBC WITH DIFFERENTIAL/PLATELET
Abs Immature Granulocytes: 0.08 10*3/uL — ABNORMAL HIGH (ref 0.00–0.07)
Basophils Absolute: 0.1 10*3/uL (ref 0.0–0.1)
Basophils Relative: 1 %
Eosinophils Absolute: 0.1 10*3/uL (ref 0.0–0.5)
Eosinophils Relative: 1 %
HCT: 33.2 % — ABNORMAL LOW (ref 36.0–46.0)
Hemoglobin: 10.9 g/dL — ABNORMAL LOW (ref 12.0–15.0)
Immature Granulocytes: 1 %
Lymphocytes Relative: 18 %
Lymphs Abs: 1.4 10*3/uL (ref 0.7–4.0)
MCH: 28.9 pg (ref 26.0–34.0)
MCHC: 32.8 g/dL (ref 30.0–36.0)
MCV: 88.1 fL (ref 80.0–100.0)
Monocytes Absolute: 0.9 10*3/uL (ref 0.1–1.0)
Monocytes Relative: 11 %
Neutro Abs: 5.3 10*3/uL (ref 1.7–7.7)
Neutrophils Relative %: 68 %
Platelets: 266 10*3/uL (ref 150–400)
RBC: 3.77 MIL/uL — ABNORMAL LOW (ref 3.87–5.11)
RDW: 17.4 % — ABNORMAL HIGH (ref 11.5–15.5)
WBC: 7.8 10*3/uL (ref 4.0–10.5)
nRBC: 0 % (ref 0.0–0.2)

## 2022-07-02 LAB — COMPREHENSIVE METABOLIC PANEL
ALT: 64 U/L — ABNORMAL HIGH (ref 0–44)
AST: 119 U/L — ABNORMAL HIGH (ref 15–41)
Albumin: 2.5 g/dL — ABNORMAL LOW (ref 3.5–5.0)
Alkaline Phosphatase: 508 U/L — ABNORMAL HIGH (ref 38–126)
Anion gap: 8 (ref 5–15)
BUN: 7 mg/dL — ABNORMAL LOW (ref 8–23)
CO2: 30 mmol/L (ref 22–32)
Calcium: 8.9 mg/dL (ref 8.9–10.3)
Chloride: 100 mmol/L (ref 98–111)
Creatinine, Ser: 1.05 mg/dL — ABNORMAL HIGH (ref 0.44–1.00)
GFR, Estimated: 54 mL/min — ABNORMAL LOW (ref >60)
Glucose, Bld: 123 mg/dL — ABNORMAL HIGH (ref 70–99)
Potassium: 3.1 mmol/L — ABNORMAL LOW (ref 3.5–5.1)
Sodium: 138 mmol/L (ref 135–145)
Total Bilirubin: 10 mg/dL — ABNORMAL HIGH (ref 0.3–1.2)
Total Protein: 7 g/dL (ref 6.5–8.1)

## 2022-07-02 MED ORDER — SODIUM CHLORIDE 0.9 % IV SOLN: INTRAVENOUS | Status: DC

## 2022-07-02 MED ORDER — POTASSIUM CHLORIDE CRYS ER 10 MEQ PO TBCR
10.0000 meq | EXTENDED_RELEASE_TABLET | Freq: Every day | ORAL | Status: DC
  Administered 2022-07-02 – 2022-07-03 (×2): 10 meq via ORAL
  Filled 2022-07-02 (×4): qty 1

## 2022-07-02 MED ORDER — IOHEXOL 300 MG/ML  SOLN
100.0000 mL | Freq: Once | INTRAMUSCULAR | Status: AC | PRN
  Administered 2022-07-02: 75 mL

## 2022-07-02 MED ORDER — MEPERIDINE HCL 25 MG/ML IJ SOLN
INTRAMUSCULAR | Status: AC
  Filled 2022-07-02: qty 1

## 2022-07-02 MED ORDER — AMLODIPINE BESYLATE 10 MG PO TABS
10.0000 mg | ORAL_TABLET | Freq: Every day | ORAL | Status: DC
  Administered 2022-07-03: 10 mg via ORAL
  Filled 2022-07-02: qty 1

## 2022-07-02 MED ORDER — MIDAZOLAM HCL 2 MG/2ML IJ SOLN
INTRAMUSCULAR | Status: AC | PRN
  Administered 2022-07-02: .5 mg via INTRAVENOUS
  Administered 2022-07-02: 1 mg via INTRAVENOUS

## 2022-07-02 MED ORDER — SODIUM CHLORIDE 0.9 % IV SOLN
2.0000 g | INTRAVENOUS | Status: AC
  Administered 2022-07-02: 2 g via INTRAVENOUS
  Filled 2022-07-02 (×4): qty 2

## 2022-07-02 MED ORDER — SODIUM CHLORIDE 0.9% FLUSH
5.0000 mL | Freq: Three times a day (TID) | INTRAVENOUS | Status: DC
  Administered 2022-07-02 – 2022-07-03 (×3): 5 mL

## 2022-07-02 MED ORDER — FENTANYL CITRATE (PF) 100 MCG/2ML IJ SOLN
INTRAMUSCULAR | Status: AC | PRN
  Administered 2022-07-02: 50 ug via INTRAVENOUS

## 2022-07-02 MED ORDER — FENTANYL CITRATE (PF) 100 MCG/2ML IJ SOLN
INTRAMUSCULAR | Status: AC
  Filled 2022-07-02: qty 2

## 2022-07-02 MED ORDER — BISOPROLOL-HYDROCHLOROTHIAZIDE 5-6.25 MG PO TABS
1.0000 | ORAL_TABLET | Freq: Every day | ORAL | Status: DC
  Administered 2022-07-03: 1 via ORAL
  Filled 2022-07-02 (×3): qty 1

## 2022-07-02 MED ORDER — PSYLLIUM 95 % PO PACK
1.0000 | PACK | Freq: Every day | ORAL | Status: DC
  Filled 2022-07-02: qty 1

## 2022-07-02 MED ORDER — LIDOCAINE HCL 1 % IJ SOLN
INTRAMUSCULAR | Status: AC
  Filled 2022-07-02: qty 20

## 2022-07-02 MED ORDER — TRAMADOL HCL 50 MG PO TABS
50.0000 mg | ORAL_TABLET | Freq: Four times a day (QID) | ORAL | Status: DC | PRN
  Administered 2022-07-03: 50 mg via ORAL
  Filled 2022-07-02: qty 1

## 2022-07-02 MED ORDER — MIDAZOLAM HCL 2 MG/2ML IJ SOLN
INTRAMUSCULAR | Status: AC
  Filled 2022-07-02: qty 2

## 2022-07-02 MED ORDER — ROSUVASTATIN CALCIUM 5 MG PO TABS
5.0000 mg | ORAL_TABLET | Freq: Every day | ORAL | Status: DC
  Administered 2022-07-03: 5 mg via ORAL
  Filled 2022-07-02: qty 1

## 2022-07-02 NOTE — Procedures (Signed)
Interventional Radiology Procedure Note  Procedure:  1.) Exchange of LEFT 9F int/ext biliary drain.  2.) Placement of a new 71F int/ext biliary drain via the right posterior ducts.   Complications: None immediate  Estimated Blood Loss: None  Recommendations: - Admit for obs - Anticipate DC in am    Signed,  Criselda Peaches, MD

## 2022-07-02 NOTE — H&P (Signed)
Chief Complaint: Patient was seen in consultation today for left biliary drain exchange, possible new right biliary drain placement.   Referring Physician(s): Ladell Pier  Supervising Physician: Jacqulynn Cadet  Patient Status: [facility] - Out-pt  History of Present Illness: Barbara Byrd is a 81 y.o. female with a medical history significant for cholangiocarcinoma, diagnosed Summer 2019. She underwent a Whipple procedure 11/15/2018 was also treated with adjuvant chemotherapy for 6 months. She recently relocated to [place], [place] and transferred her care to Dr. Benay Spice last fall. A restaging PET 04/24/22 showed multifocal hypermetabolic liver lesions compatible with metastases. She had also developed worsening abdominal pain and weight loss. An MRI 05/17/22 revealed left and right intrahepatic biliary dilatation suggesting obstruction and because of her Whipple anatomy, she was not a candidate for ERCP.  She was referred to IR for percutaneous biliary drain/stent placement with diagnostic brushings. She was first seen in IR 06/02/22 for a cholangiogram and internal/external biliary drain placed into the left hepatic bile duct. A biopsy was not performed. She was admitted post-procedure due to hypotension and bradycardia and was discharged home 06/05/22.   She returned to IR 06/15/22 for a cholangiogram, drain exchange/upsize and a brush biopsy. Pathology returned positive for malignant cells consistent with adenocarcinoma.   She later developed hyperbilirubinemia and a CT scan 06/27/22 showed intrahepatic biliary ductal dilatation. IR was consulted for further treatment and she was approved by Dr. Serafina Royals for a right biliary drain placement and left biliary drain exchange - CT showed this drain to be significantly retracted. The patient will be admitted for planned overnight observation post-procedure.  Past Medical History:  Diagnosis Date   Family history of cancer of pituitary gland and  craniopharyngeal duct    Family history of prostate cancer     Past Surgical History:  Procedure Laterality Date   IR CHOLANGIOGRAM EXISTING TUBE  06/15/2022   IR ENDOLUMINAL BX OF BILIARY TREE  06/15/2022   IR INT EXT BILIARY DRAIN WITH CHOLANGIOGRAM  06/02/2022   IR REMOVAL TUN ACCESS W/ PORT W/O FL MOD SED  11/14/2021    Allergies: Patient has no known allergies.  Medications: Prior to Admission medications   Medication Sig Start Date End Date Taking? Authorizing Provider  amLODipine (NORVASC) 10 MG tablet Take 10 mg by mouth daily. 04/15/21   [provider]  bisoprolol-hydrochlorothiazide (ZIAC) 5-6.25 MG tablet Take 1 tablet by mouth daily. 04/15/21   [provider]  potassium chloride (KLOR-CON) 10 MEQ tablet Take 1 tablet (10 mEq total) by mouth daily. 04/20/22   Owens Shark, NP  psyllium (METAMUCIL) 58.6 % packet Take 1 packet by mouth daily as needed (constipation).    [provider]  rosuvastatin (CRESTOR) 5 MG tablet Take 5 mg by mouth daily. 04/15/21   [provider]  traMADol (ULTRAM) 50 MG tablet Take 1 tablet (50 mg total) by mouth every 6 (six) hours as needed for moderate pain. 06/26/22   Ladell Pier, MD     Family History  Problem Relation Age of Onset   Heart disease Mother 39   Cerebral aneurysm Father 24   Brain cancer Brother 78       pituitary tumor   Stroke Maternal Grandmother    Brain cancer Nephew 38       pituitary gland   Prostate cancer Nephew 31    Social History   Socioeconomic History   Marital status: Single    Spouse name: Not on file   Number of  children: Not on file   Years of education: Not on file   Highest education level: Not on file  Occupational History   Not on file  Tobacco Use   Smoking status: Never   Smokeless tobacco: Not on file  Vaping Use   Vaping Use: Never used  Substance and Sexual Activity   Alcohol use: Yes    Comment: socially   Drug use: Never   Sexual activity:  Not on file  Other Topics Concern   Not on file  Social History Narrative   Not on file   Social Determinants of Health   Financial Resource Strain: Not on file  Food Insecurity: Not on file  Transportation Needs: Not on file  Physical Activity: Not on file  Stress: Not on file  Social Connections: Not on file    Review of Systems: A 12 point ROS discussed and pertinent positives are indicated in the HPI above.  All other systems are negative.  Review of Systems  Constitutional:  Positive for appetite change and fatigue.  Respiratory:  Negative for cough and shortness of breath.   Cardiovascular:  Negative for chest pain and leg swelling.  Gastrointestinal:  Positive for abdominal pain. Negative for diarrhea, nausea and vomiting.  Neurological:  Negative for dizziness and headaches.    Vital Signs: BP 119/73 (BP Location: Right Wrist)   Pulse 63   Temp 98 F (36.7 C) (Oral)   Ht '5\' 4"'$  (1.626 m)   Wt 228 lb 3.2 oz (103.5 kg)   SpO2 99%   BMI 39.17 kg/m   Physical Exam Constitutional:      General: She is not in acute distress.    Appearance: She is obese. She is not ill-appearing.  HENT:     Mouth/Throat:     Mouth: Mucous membranes are moist.     Pharynx: Oropharynx is clear.  Cardiovascular:     Rate and Rhythm: Normal rate and regular rhythm.     Pulses: Normal pulses.     Heart sounds: Normal heart sounds.  Pulmonary:     Effort: Pulmonary effort is normal.     Breath sounds: Normal breath sounds.  Abdominal:     Palpations: Abdomen is soft.     Tenderness: There is abdominal tenderness.     Comments: Left biliary drain to gravity. Dressing is clean, dry and intact. Approximately 50 ml of bile in bag.   Musculoskeletal:     Right lower leg: No edema.     Left lower leg: No edema.  Skin:    General: Skin is warm and dry.  Neurological:     Mental Status: She is alert and oriented to person, place, and time.  Psychiatric:        Mood and Affect: Mood  normal.        Behavior: Behavior normal.        Thought Content: Thought content normal.        Judgment: Judgment normal.     Imaging: CT Abdomen Pelvis W Contrast  Result Date: 06/27/2022 CLINICAL DATA:  Cholangiocarcinoma, hyperbilirubinemia, left-sided percutaneous biliary drain placement with brush biopsy last week * Tracking Code: BO * EXAM: CT ABDOMEN AND PELVIS WITH CONTRAST TECHNIQUE: Multidetector CT imaging of the abdomen and pelvis was performed using the standard protocol following bolus administration of intravenous contrast. RADIATION DOSE REDUCTION: This exam was performed according to the departmental dose-optimization program which includes automated exposure control, adjustment of the mA and/or kV according to patient  size and/or use of iterative reconstruction technique. CONTRAST:  160m OMNIPAQUE IOHEXOL 300 MG/ML  SOLN COMPARISON:  CT chest abdomen pelvis, 02/27/2022, MR abdomen, 05/17/2022 FINDINGS: Lower chest: Numerous small pulmonary nodules in the included bilateral lung bases which are new and enlarged compared to prior examination dated 02/27/2022. Hepatobiliary: Interval placement of left-sided percutaneous biliary drain, formed pigtail in the vicinity of the porta hepatis. Similar intrahepatic biliary ductal dilatation when compared to prior MR, most notably in the left lobe of the liver (series 2, image 13). The confluence of the common bile duct as well as the portal vein are effaced by an unchanged soft tissue mass in the porta hepatis, although ill-defined measuring approximately 3.5 x 3.5 cm (series 2, image 25). Status post cholecystectomy and hepatojejunostomy. Pancreas: Status post Whipple pancreaticoduodenectomy. No pancreatic ductal dilatation or surrounding inflammatory changes. Spleen: Normal in size without significant abnormality. Adrenals/Urinary Tract: Adrenal glands are unremarkable. Kidneys are normal, without renal calculi, solid lesion, or hydronephrosis.  Bladder is unremarkable. Stomach/Bowel: Status post distal gastrectomy and gastrojejunostomy appendix is not clearly visualized and may be surgically absent. Mild wall thickening and fat stranding about the ascending colon (series 2, image 44). Vascular/Lymphatic: Aortic atherosclerosis. No enlarged abdominal or pelvic lymph nodes. Reproductive: Status post hysterectomy. Other: Fat and fluid containing umbilical hernia. New, small volume free fluid in the low pelvis (series 2, image 69). Musculoskeletal: No acute or significant osseous findings. IMPRESSION: 1. Interval placement of left-sided percutaneous biliary drain, formed pigtail in the vicinity of the porta hepatis. Similar intrahepatic biliary ductal dilatation when compared to prior MR, most notably in the left lobe of the liver. 2. The confluence of the common bile duct as well as the portal vein are effaced by an unchanged soft tissue mass in the porta hepatis, consistent with known cholangiocarcinoma. 3. Status post Whipple pancreaticoduodenectomy. 4. Numerous small pulmonary nodules in the included bilateral lung bases which are new and enlarged compared to prior examination dated 02/27/2022. 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 5. Mild wall thickening and fat stranding about the ascending colon, consistent with nonspecific colitis. 6. New, small volume free fluid in the low pelvis, likely reactive. These results will be called to the ordering clinician or representative by the Radiologist Assistant, and communication documented in the PACS or [employer] Aortic Atherosclerosis (ICD10-I70.0). Electronically Signed   By: ADelanna AhmadiM.D.   On: 06/27/2022 15:34   IR CHOLANGIOGRAM EXISTING TUBE  Result Date: 06/16/2022 INDICATION: 81year old female with history of hilar cholangiocarcinoma status post pancreaticoduodenectomy in 2019 and more recently status post left-sided biliary drain placement on 06/02/2022. The  patient presents for biliary brush biopsy. EXAM: 1. Cholangiogram via indwelling left biliary drain access 2. Biliary brush biopsy MEDICATIONS: 2 g cefoxitin; The antibiotic was administered within an appropriate time frame prior to the initiation of the procedure. ANESTHESIA/SEDATION: Moderate (conscious) sedation was employed during this procedure. A total of Versed 1 mg and Fentanyl 50 mcg was administered intravenously. Moderate Sedation Time: 14 minutes. The patient's level of consciousness and vital signs were monitored continuously by radiology nursing throughout the procedure under my direct supervision. FLUOROSCOPY TIME:  Fluoroscopy Time: 2.1 minutes (63 mGy). CONTRAST:  16 mL Omnipaque 300, intrabiliary COMPLICATIONS: None immediate. PROCEDURE: Informed written consent was obtained from the patient after a thorough discussion of the procedural risks, benefits and alternatives. All questions were addressed. Maximal Sterile Barrier Technique was utilized including caps, mask, sterile gowns, sterile gloves, sterile drape, hand hygiene and skin antiseptic.  A timeout was performed prior to the initiation of the procedure. The indwelling left biliary drain was prepped and draped in standard fashion. Hand injection of contrast demonstrated patency of the indwelling drain with prompt antegrade flow through the terminus of the catheter into the third portion of the duodenum. There is no evidence of significant intrahepatic biliary ductal dilation. Subdermal Local anesthesia was administered at the planned suture site as well as along the subcutaneous tract of the indwelling drain with 1% lidocaine. The external portion of the catheter was cut to release the pigtail. An Amplatz wire was inserted and coiled in the fourth portion of the duodenum. The indwelling drain was then removed. Next, an 8 [place], 25 cm vascular sheath was placed over the drain in position with the tip in the duodenum. Pull-back sheath  cholangiogram was then performed. Cholangiogram was significant for patent but stenotic common bile duct at the level of the hilum. There is moderate intrahepatic biliary ductal dilation of the central left biliary tree. No opacification of the right hepatic ducts were visualized. Next, biliary brush biopsy was performed about the hilum x3. Samples were placed on saline soaked Telfa and sent to cytology. After serial dilation, a 34 [place] biliary drain was then replaced via the indwelling established track in the left bile ducts. Gentle hand injection of contrast demonstrated patency of the replaced drain with prompt antegrade flow of contrast into the duodenum. The catheter was affixed to the skin with an interrupted 0 silk suture. A sterile bandage was applied. The drain was placed to bag drainage. The patient tolerated the procedure well was transferred to the recovery area in good condition. IMPRESSION: 1. Cholangiogram significant for central left biliary stenosis. 2. Technically successful biliary brush biopsy about the central bile ducts. 3. Successful exchange and upsize of indwelling left biliary drain. PLAN: Keep biliary drain to bag drainage for now. Follow-up cytology results. If the patient has sustained or increasing hyperbilirubinemia, recommend repeat CT abdomen with contrast to assess for excluded right intrahepatic ducts, which if present, would require additional right-sided biliary drain placement. Ruthann Cancer, MD [facility] Electronically Signed   By: Ruthann Cancer M.D.   On: 06/16/2022 10:23   IR ENDOLUMINAL BX OF BILIARY TREE  Result Date: 06/16/2022 INDICATION: 81 year old female with history of hilar cholangiocarcinoma status post pancreaticoduodenectomy in 2019 and more recently status post left-sided biliary drain placement on 06/02/2022. The patient presents for biliary brush biopsy. EXAM: 1. Cholangiogram via indwelling  left biliary drain access 2. Biliary brush biopsy MEDICATIONS: 2 g cefoxitin; The antibiotic was administered within an appropriate time frame prior to the initiation of the procedure. ANESTHESIA/SEDATION: Moderate (conscious) sedation was employed during this procedure. A total of Versed 1 mg and Fentanyl 50 mcg was administered intravenously. Moderate Sedation Time: 14 minutes. The patient's level of consciousness and vital signs were monitored continuously by radiology nursing throughout the procedure under my direct supervision. FLUOROSCOPY TIME:  Fluoroscopy Time: 2.1 minutes (63 mGy). CONTRAST:  16 mL Omnipaque 300, intrabiliary COMPLICATIONS: None immediate. PROCEDURE: Informed written consent was obtained from the patient after a thorough discussion of the procedural risks, benefits and alternatives. All questions were addressed. Maximal Sterile Barrier Technique was utilized including caps, mask, sterile gowns, sterile gloves, sterile drape, hand hygiene and skin antiseptic. A timeout was performed prior to the initiation of the procedure. The indwelling left biliary drain was prepped and draped in standard fashion. Hand injection of contrast demonstrated patency of the  indwelling drain with prompt antegrade flow through the terminus of the catheter into the third portion of the duodenum. There is no evidence of significant intrahepatic biliary ductal dilation. Subdermal Local anesthesia was administered at the planned suture site as well as along the subcutaneous tract of the indwelling drain with 1% lidocaine. The external portion of the catheter was cut to release the pigtail. An Amplatz wire was inserted and coiled in the fourth portion of the duodenum. The indwelling drain was then removed. Next, an 8 [place], 25 cm vascular sheath was placed over the drain in position with the tip in the duodenum. Pull-back sheath cholangiogram was then performed. Cholangiogram was significant for patent but stenotic  common bile duct at the level of the hilum. There is moderate intrahepatic biliary ductal dilation of the central left biliary tree. No opacification of the right hepatic ducts were visualized. Next, biliary brush biopsy was performed about the hilum x3. Samples were placed on saline soaked Telfa and sent to cytology. After serial dilation, a 80 [place] biliary drain was then replaced via the indwelling established track in the left bile ducts. Gentle hand injection of contrast demonstrated patency of the replaced drain with prompt antegrade flow of contrast into the duodenum. The catheter was affixed to the skin with an interrupted 0 silk suture. A sterile bandage was applied. The drain was placed to bag drainage. The patient tolerated the procedure well was transferred to the recovery area in good condition. IMPRESSION: 1. Cholangiogram significant for central left biliary stenosis. 2. Technically successful biliary brush biopsy about the central bile ducts. 3. Successful exchange and upsize of indwelling left biliary drain. PLAN: Keep biliary drain to bag drainage for now. Follow-up cytology results. If the patient has sustained or increasing hyperbilirubinemia, recommend repeat CT abdomen with contrast to assess for excluded right intrahepatic ducts, which if present, would require additional right-sided biliary drain placement. Ruthann Cancer, MD [facility] Radiology Electronically Signed   By: Ruthann Cancer M.D.   On: 06/16/2022 10:23   IR INT EXT BILIARY DRAIN WITH CHOLANGIOGRAM  Result Date: 06/03/2022 INDICATION: 81 year old with history of common bile duct cholangiocarcinoma and status post Whipple procedure in 2019. Patient now has elevated bilirubin level and concern for recurrent cancer. Patient is scheduled for placement of a percutaneous transhepatic cholangiogram and biliary drain. EXAM: 1. Percutaneous transhepatic cholangiogram using ultrasound and  fluoroscopic guidance 2. Placement of biliary drain MEDICATIONS: Cefoxitin 2 g; The antibiotic was administered within an appropriate time frame prior to the initiation of the procedure. ANESTHESIA/SEDATION: Moderate (conscious) sedation was employed during this procedure. A total of Versed 4.0 mg and Fentanyl 150 mcg was administered intravenously by the radiology nurse. Total intra-service moderate Sedation Time: 72 minutes. The patient's level of consciousness and vital signs were monitored continuously by radiology nursing throughout the procedure under my direct supervision. FLUOROSCOPY: Radiation Exposure Index (as provided by the fluoroscopic device): 54 mGy Kerma COMPLICATIONS: None immediate. PROCEDURE: Informed written consent was obtained from the patient after a thorough discussion of the procedural risks, benefits and alternatives. All questions were addressed. Maximal Sterile Barrier Technique was utilized including caps, mask, sterile gowns, sterile gloves, sterile drape, hand hygiene and skin antiseptic. A timeout was performed prior to the initiation of the procedure. Patient was placed supine on the interventional table. The anterior and right side of the abdomen was prepped and draped in sterile fashion. Liver was thoroughly evaluated with ultrasound. The right side of the abdomen was  anesthetized with 1% lidocaine and a 22 gauge needle was directed into the right hepatic lobe with ultrasound guidance. There was minimal peripheral bile duct dilatation based on ultrasound. Therefore, the needle was advanced centrally into the liver using fluoroscopy. Needle was slowly pulled back under fluoroscopy as the needle was injected with contrast. Biliary system was opacified with contrast. Cholangiogram only opacified a few central ducts and contrast was draining into the small bowel via the surgical anastomosis. Based on the minimal right biliary dilatation and lack of obstruction at the surgical  anastomosis, attention was directed to the dilated left hepatic ducts. Using ultrasound guidance, a 21 and 22 gauge needles were directed into the left hepatic lobe. A dilated structure thought to represent the biliary system appeared to represent a portal vein although there was not much color Doppler flow within this structure. Another dilated structure in the left hepatic lobe was punctured with ultrasound and contrast demonstrated slow filling of the structure with filling defects. Eventually, contrast was draining through the surgical anastomosis into the small bowel. 0.018 wire was easily advanced into the small bowel. A transitional dilator set was placed. Super stiff Amplatz wire was placed and the tract was dilated to accommodate a 10 [place] biliary drain which was advanced into the small bowel. Sample of bile was collected and sent for cytology. Drain was attached to a gravity bag and sutured in place. Dressing was placed. Procedure was performed by Dr. Markus Daft and Dr. Daryll Brod. FINDINGS: Ultrasound demonstrated central intrahepatic biliary dilatation but minimal peripheral biliary dilatation. Percutaneous transhepatic cholangiogram from the right hepatic lobe demonstrated irregular dilated central bile ducts with drainage through the surgical anastomosis into the small bowel. There appeared to be narrowing at the surgical anastomosis. Percutaneous transhepatic cholangiogram from a dilated left hepatic bile duct demonstrated filling defects within the bile duct. Contrast eventually drained into the small bowel through the hepaticojejunostomy. Interestingly, no other significant bile ducts were communicating with this dilated left bile duct. Drain extends into the small bowel. IMPRESSION: 1. Percutaneous transhepatic cholangiogram demonstrates dilated central bile ducts with a patent hepaticojejunostomy. There appeared to be narrowing at the anastomosis on the right cholangiogram and there were  filling defects within a dilated left bile duct. Filling defects within the dilated left bile duct could represent sludge or clot. There was minimal communication between the intrahepatic bile ducts which may be related to the patent hepaticojejunostomy. 2. Internal/external biliary drain was placed via a dilated left hepatic bile duct. Drain extends into the small bowel. 3. Patient will eventually need a follow-up cholangiogram to evaluate the intrahepatic biliary system and may need a brush biopsy. Electronically Signed   By: Markus Daft M.D.   On: 06/03/2022 07:49    Labs:  CBC: Recent Labs    06/11/22 0932 06/15/22 1242 06/26/22 0911 07/02/22 1124  WBC 12.8* 8.5 6.2 7.8  HGB 10.6* 10.7* 11.0* 10.9*  HCT 32.6* 33.4* 33.5* 33.2*  PLT 448* 358 301 266    COAGS: Recent Labs    06/02/22 1218 06/15/22 1242 07/02/22 1124  INR 1.3* 1.2 1.3*    BMP: Recent Labs    06/11/22 0932 06/15/22 1242 06/26/22 0911 07/02/22 1124  NA 131* 138 138 138  K 4.2 4.6 3.5 3.1*  CL 95* 99 99 100  CO2 '27 29 27 30  '$ GLUCOSE 170* 124* 187* 123*  BUN '12 10 12 '$ 7*  CALCIUM 9.6 9.2 9.7 8.9  CREATININE 0.98 1.23* 1.04* 1.05*  GFRNONAA 59* 45*  55* 54*    LIVER FUNCTION TESTS: Recent Labs    06/05/22 0052 06/11/22 0932 06/15/22 1242 06/26/22 0911 07/02/22 1124  BILITOT 3.4* 5.1* 5.2* 6.9* 10.0*  AST 43* 39  --  57* 119*  ALT 33 19  --  34 64*  ALKPHOS 295* 382*  --  420* 508*  PROT 5.9* 7.5  --  7.3 7.0  ALBUMIN 1.6* 3.2*  --  3.5 2.5*    TUMOR MARKERS: No results for input(s): "AFPTM", "CEA", "CA199", "CHROMGRNA" in the last 8760 hours.  Assessment and Plan:  Cholangiocarcinoma with biliary obstruction: [name], 81 year old female, presents today to the [facility] Interventional Radiology department for an image-guided left biliary drain exchange and possible placement of a right biliary drain. She will be admitted for overnight observation following this procedure.   Risks and  benefits were discussed with the patient including, but not limited to, bleeding, infection, bile leak, sepsis or even death.  All of the patient's questions were answered, patient is agreeable to proceed. She has been NPO.   Consent signed and in chart.  Thank you for this interesting consult.  I greatly enjoyed meeting Lourie Girgis and look forward to participating in their care.  A copy of this report was sent to the requesting provider on this date.  Electronically Signed: Soyla Dryer, AGACNP-BC [phone] 07/02/2022, 12:12 PM   I spent a total of  30 Minutes   in face to face in clinical consultation, greater than 50% of which was counseling/coordinating care for left biliary drain exchange, possible right biliary drain placement.

## 2022-07-02 NOTE — Plan of Care (Signed)

## 2022-07-03 DIAGNOSIS — C221 Intrahepatic bile duct carcinoma: Secondary | ICD-10-CM | POA: Diagnosis not present

## 2022-07-03 NOTE — Progress Notes (Signed)
Discharge paperwork reviewed with patient at this time. No complaints of pain have been made. Patient has been provided with supplies for dressing changes. IV has been removed.

## 2022-07-03 NOTE — Plan of Care (Signed)

## 2022-07-03 NOTE — Discharge Summary (Signed)
Patient ID: Barbara Byrd MRN: [id number] DOB/AGE: 12-Apr-1941 81 y.o.  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   Discharged Condition: good  Hospital Course:   Barbara Byrd is a 81 y.o. female with a medical history significant for cholangiocarcinoma, diagnosed Summer 2019. She underwent a Whipple procedure 11/15/2018 and was also treated with adjuvant chemotherapy for 6 months. She recently relocated to [place], [place] and transferred her care to Dr. Benay Spice last fall. A restaging PET 04/24/22 showed multifocal hypermetabolic liver lesions compatible with metastases. She had also developed worsening abdominal pain and weight loss. An MRI 05/17/22 revealed left and right intrahepatic biliary dilatation suggesting obstruction and because of her Whipple anatomy, she was not a candidate for ERCP.   She was referred to IR for percutaneous biliary drain/stent placement with diagnostic brushings. She was first seen in IR 06/02/22 for a cholangiogram and internal/external biliary drain placed into the left hepatic bile duct. A biopsy was not performed. She was admitted post-procedure due to hypotension and bradycardia and was discharged home 06/05/22.    She returned to IR 06/15/22 for a cholangiogram, drain exchange/upsize and a brush biopsy. Pathology returned positive for malignant cells consistent with adenocarcinoma.    She later developed hyperbilirubinemia and a CT scan 06/27/22 showed intrahepatic biliary ductal dilatation. IR was consulted for further treatment and she was approved by Dr. Serafina Royals for a right biliary drain placement and left biliary drain exchange - CT showed this drain to be significantly retracted. She presented to IR yesterday and Dr. Laurence Ferrari exchanged the left  internal/external biliary drain and placed a new right internal/external 10 Fr biliary drain. She was admitted for planned overnight observation. She had an uneventful evening and is feeling well this morning. She has been eating/drinking/ambulating/voiding. She has no complaints at this time.   She is familiar with drain care and requires no additional teaching. She was given flushes, gauze and tape. She knows to call our office with any questions/concern related to the drain, or if she needs a refill on flushes.   She will follow up with [facility] in approximately 6-8 weeks for drain evaluations with possible exchanges. A scheduler from our office will call her with a date/time.   Consults: None  Significant Diagnostic Studies: IR BILIARY DRAIN PLACEMENT WITH CHOLANGIOGRAM  Result Date: 07/02/2022 INDICATION: 81 year old female with recurrent cholangiocarcinoma and hilar mass with multifocal biliary ductal obstruction. She presents for exchange and repositioning of existing left biliary drain as well as attempted placement of a right-sided biliary drainage catheter for persistent hyperbilirubinemia. EXAM: 1. Percutaneous transhepatic cholangiogram through existing access 2. Successful placement of a new 37 French right-sided internal/external biliary drainage catheter. 3. Exchange of existing left 12 French internal/external biliary drain. MEDICATIONS: 2 g Mefoxin; The antibiotic was administered within an appropriate time frame prior to the initiation of the procedure. ANESTHESIA/SEDATION: Moderate (conscious) sedation was employed during this procedure. A total of Versed 1.5 mg and Fentanyl 50 mcg was administered intravenously by the radiology nurse. Total intra-service moderate Sedation Time: 54 minutes. The patient's level of consciousness and vital signs were monitored continuously by radiology nursing throughout the procedure under my direct supervision. FLUOROSCOPY: Radiation Exposure  Index (as provided by the fluoroscopic device): 734 mGy Kerma COMPLICATIONS: None immediate. PROCEDURE: Informed written consent was obtained from the patient after a thorough discussion of the procedural risks,  benefits and alternatives. All questions were addressed. Maximal Sterile Barrier Technique was utilized including caps, mask, sterile gowns, sterile gloves, sterile drape, hand hygiene and skin antiseptic. A timeout was performed prior to the initiation of the procedure. An initial contrast injection was performed through the existing percutaneous biliary drainage catheter. This is a left approach biliary drainage catheter. The tip is pulled back slightly but remains just within the small bowel. Unfortunately, no significant contrast material opacified the ductal system. There is preferential flow into the small bowel. Therefore, the tube was transected and removed over a wire. An 8 French sheath was advanced over the wire and into the left intrahepatic ducts. Additional cholangiography was performed. Only the left-sided ducts opacify. There is complete occlusion of the ductal system in the region of the intrahepatic confluence. Maintaining wire access through the obstruction and into the duodenum, an angled catheter was advanced coaxially through the drain. With the assistance of a Glidewire, the right-sided ductal system was successfully catheterized. Additional cholangiography was then performed through the catheter. There is a stellate obstructing mass at the intrahepatic biliary ductal confluence. Unfortunately, numerous ducts exist as isolated and obstructed segments. The anterior and posterior ductal systems are completely separate and do not communicate. Additionally, the posterior ductal segments are also divided into at least 2 separate segments. It appears that the segment 6 ducts are most approachable for percutaneous access and may provide the most significant drainage. Therefore, a suitable  skin entry site was selected and marked. Local anesthesia was attained by infiltration with 1% lidocaine. A 22 gauge Chiba needle was then carefully advanced under fluoroscopic guidance and used to puncture a tertiary radical of the right posterior ductal system. A 0.018 Nitrex wire was advanced into the hepatic hilum. The Accustick sheath was carefully advanced over the wire. A Glidewire was then successfully navigated through the obstructed portion of the right hepatic duct and into the small bowel. The glidewire was then exchanged for a superstiff Amplatz wire. The percutaneous tract was dilated to 10 [place]. A Cook 46 [place] biliary drainage catheter was then advanced over the wire and positioned such that the sideholes extended from the entry into the right posterior ducts through the obstructed segment and into the small bowel. Next, attention was returned to the existing left-sided duct. A new 12 [place] cook biliary drainage catheter was advanced over the wire and formed with the pigtail loop in the small bowel. Both drains were secured to the skin with 0 Prolene suture and connected to gravity bag drainage. Images were obtained and stored for the medical record. IMPRESSION: 1. Stellate mass resulting in multifocal obstruction at the confluence of the intrahepatic ducts. The left and right ductal systems are completely separate, and unfortunately, the right system is separated into at least 3 individually obstructed ductal segments. 2. Successful placement of a right-sided 10 French internal/external biliary drainage catheter via the segment 6 posterior ductal system. 3. Successful exchange and repositioning of existing left-sided 12 French internal/external biliary drainage catheter. Electronically Signed   By: Jacqulynn Cadet M.D.   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 She presents for exchange and repositioning of existing left biliary drain as well as attempted placement of a right-sided biliary drainage catheter for persistent hyperbilirubinemia. EXAM: 1. Percutaneous transhepatic cholangiogram through existing access 2. Successful placement of a new 44 French right-sided internal/external biliary drainage catheter.  3. Exchange of existing left 12 French internal/external biliary drain. MEDICATIONS: 2 g Mefoxin; The antibiotic was administered within an appropriate time frame prior to the initiation of the procedure. ANESTHESIA/SEDATION: Moderate (conscious) sedation was employed during this procedure. A total of Versed 1.5 mg and Fentanyl 50 mcg was administered intravenously by the radiology nurse. Total intra-service moderate Sedation Time: 54 minutes. The patient's level of consciousness and vital signs were monitored continuously by radiology nursing throughout the procedure under my direct supervision. FLUOROSCOPY: Radiation Exposure Index (as provided by the fluoroscopic device): 546 mGy Kerma COMPLICATIONS: None immediate. PROCEDURE: Informed written consent was obtained from the patient after a thorough discussion of the procedural risks, benefits and alternatives. All questions were addressed. Maximal Sterile Barrier Technique was utilized including caps, mask, sterile gowns, sterile gloves, sterile drape, hand hygiene and skin antiseptic. A timeout was performed prior to the initiation of the procedure. An initial contrast injection was performed through the existing percutaneous biliary drainage catheter. This is a left approach biliary drainage catheter. The tip is pulled back slightly but remains just within the small bowel. Unfortunately, no significant contrast material opacified the ductal system. There is preferential flow into the small bowel. Therefore, the tube was transected and removed over a wire. An 8 French sheath was  advanced over the wire and into the left intrahepatic ducts. Additional cholangiography was performed. Only the left-sided ducts opacify. There is complete occlusion of the ductal system in the region of the intrahepatic confluence. Maintaining wire access through the obstruction and into the duodenum, an angled catheter was advanced coaxially through the drain. With the assistance of a Glidewire, the right-sided ductal system was successfully catheterized. Additional cholangiography was then performed through the catheter. There is a stellate obstructing mass at the intrahepatic biliary ductal confluence. Unfortunately, numerous ducts exist as isolated and obstructed segments. The anterior and posterior ductal systems are completely separate and do not communicate. Additionally, the posterior ductal segments are also divided into at least 2 separate segments. It appears that the segment 6 ducts are most approachable for percutaneous access and may provide the most significant drainage. Therefore, a suitable skin entry site was selected and marked. Local anesthesia was attained by infiltration with 1% lidocaine. A 22 gauge Chiba needle was then carefully advanced under fluoroscopic guidance and used to puncture a tertiary radical of the right posterior ductal system. A 0.018 Nitrex wire was advanced into the hepatic hilum. The Accustick sheath was carefully advanced over the wire. A Glidewire was then successfully navigated through the obstructed portion of the right hepatic duct and into the small bowel. The glidewire was then exchanged for a superstiff Amplatz wire. The percutaneous tract was dilated to 10 [place]. A Cook 46 [place] biliary drainage catheter was then advanced over the wire and positioned such that the sideholes extended from the entry into the right posterior ducts through the obstructed segment and into the small bowel. Next, attention was returned to the existing left-sided duct. A new 12 [place]  cook biliary drainage catheter was advanced over the wire and formed with the pigtail loop in the small bowel. Both drains were secured to the skin with 0 Prolene suture and connected to gravity bag drainage. Images were obtained and stored for the medical record. IMPRESSION: 1. Stellate mass resulting in multifocal obstruction at the confluence of the intrahepatic ducts. The left and right ductal systems are completely separate, and unfortunately, the right system is separated into at least 3 individually obstructed ductal segments.  2. Successful placement of a right-sided 10 French internal/external biliary drainage catheter via the segment 6 posterior ductal system. 3. Successful exchange and repositioning of existing left-sided 12 French internal/external biliary drainage catheter. Electronically Signed   By: Jacqulynn Cadet M.D.   On: 07/02/2022 18:24   CT Abdomen Pelvis W Contrast  Result Date: 06/27/2022 CLINICAL DATA:  Cholangiocarcinoma, hyperbilirubinemia, left-sided percutaneous biliary drain placement with brush biopsy last week * Tracking Code: BO * EXAM: CT ABDOMEN AND PELVIS WITH CONTRAST TECHNIQUE: Multidetector CT imaging of the abdomen and pelvis was performed using the standard protocol following bolus administration of intravenous contrast. RADIATION DOSE REDUCTION: This exam was performed according to the departmental dose-optimization program which includes automated exposure control, adjustment of the mA and/or kV according to patient size and/or use of iterative reconstruction technique. CONTRAST:  179m OMNIPAQUE IOHEXOL 300 MG/ML  SOLN COMPARISON:  CT chest abdomen pelvis, 02/27/2022, MR abdomen, 05/17/2022 FINDINGS: Lower chest: Numerous small pulmonary nodules in the included bilateral lung bases which are new and enlarged compared to prior examination dated 02/27/2022. Hepatobiliary: Interval placement of left-sided percutaneous biliary drain, formed pigtail in the vicinity of the  porta hepatis. Similar intrahepatic biliary ductal dilatation when compared to prior MR, most notably in the left lobe of the liver (series 2, image 13). The confluence of the common bile duct as well as the portal vein are effaced by an unchanged soft tissue mass in the porta hepatis, although ill-defined measuring approximately 3.5 x 3.5 cm (series 2, image 25). Status post cholecystectomy and hepatojejunostomy. Pancreas: Status post Whipple pancreaticoduodenectomy. No pancreatic ductal dilatation or surrounding inflammatory changes. Spleen: Normal in size without significant abnormality. Adrenals/Urinary Tract: Adrenal glands are unremarkable. Kidneys are normal, without renal calculi, solid lesion, or hydronephrosis. Bladder is unremarkable. Stomach/Bowel: Status post distal gastrectomy and gastrojejunostomy appendix is not clearly visualized and may be surgically absent. Mild wall thickening and fat stranding about the ascending colon (series 2, image 44). Vascular/Lymphatic: Aortic atherosclerosis. No enlarged abdominal or pelvic lymph nodes. Reproductive: Status post hysterectomy. Other: Fat and fluid containing umbilical hernia. New, small volume free fluid in the low pelvis (series 2, image 69). Musculoskeletal: No acute or significant osseous findings. IMPRESSION: 1. Interval placement of left-sided percutaneous biliary drain, formed pigtail in the vicinity of the porta hepatis. Similar intrahepatic biliary ductal dilatation when compared to prior MR, most notably in the left lobe of the liver. 2. The confluence of the common bile duct as well as the portal vein are effaced by an unchanged soft tissue mass in the porta hepatis, consistent with known cholangiocarcinoma. 3. Status post Whipple pancreaticoduodenectomy. 4. Numerous small pulmonary nodules in the included bilateral lung bases which are new and enlarged compared to prior examination dated 02/27/2022. 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 5. Mild wall thickening and fat stranding about the ascending colon, consistent with nonspecific colitis. 6. New, small volume free fluid in the low pelvis, likely reactive. These results will be called to the ordering clinician or representative by the Radiologist Assistant, and communication documented in the PACS or [employer] Aortic Atherosclerosis (ICD10-I70.0). Electronically Signed   By: ADelanna AhmadiM.D.   On: 06/27/2022 15:34   IR CHOLANGIOGRAM EXISTING TUBE  Result Date: 06/16/2022 INDICATION: 81year old female with history of hilar cholangiocarcinoma status post pancreaticoduodenectomy in 2019 and more recently status post left-sided biliary drain placement on 06/02/2022. The patient presents for biliary brush biopsy. EXAM: 1. Cholangiogram via indwelling left biliary drain access 2. Biliary  brush biopsy MEDICATIONS: 2 g cefoxitin; The antibiotic was administered within an appropriate time frame prior to the initiation of the procedure. ANESTHESIA/SEDATION: Moderate (conscious) sedation was employed during this procedure. A total of Versed 1 mg and Fentanyl 50 mcg was administered intravenously. Moderate Sedation Time: 14 minutes. The patient's level of consciousness and vital signs were monitored continuously by radiology nursing throughout the procedure under my direct supervision. FLUOROSCOPY TIME:  Fluoroscopy Time: 2.1 minutes (63 mGy). CONTRAST:  16 mL Omnipaque 300, intrabiliary COMPLICATIONS: None immediate. PROCEDURE: Informed written consent was obtained from the patient after a thorough discussion of the procedural risks, benefits and alternatives. All questions were addressed. Maximal Sterile Barrier Technique was utilized including caps, mask, sterile gowns, sterile gloves, sterile drape, hand hygiene and skin antiseptic. A timeout was performed prior to the initiation of the procedure. The indwelling left biliary drain was prepped and draped in  standard fashion. Hand injection of contrast demonstrated patency of the indwelling drain with prompt antegrade flow through the terminus of the catheter into the third portion of the duodenum. There is no evidence of significant intrahepatic biliary ductal dilation. Subdermal Local anesthesia was administered at the planned suture site as well as along the subcutaneous tract of the indwelling drain with 1% lidocaine. The external portion of the catheter was cut to release the pigtail. An Amplatz wire was inserted and coiled in the fourth portion of the duodenum. The indwelling drain was then removed. Next, an 8 [place], 25 cm vascular sheath was placed over the drain in position with the tip in the duodenum. Pull-back sheath cholangiogram was then performed. Cholangiogram was significant for patent but stenotic common bile duct at the level of the hilum. There is moderate intrahepatic biliary ductal dilation of the central left biliary tree. No opacification of the right hepatic ducts were visualized. Next, biliary brush biopsy was performed about the hilum x3. Samples were placed on saline soaked Telfa and sent to cytology. After serial dilation, a 65 [place] biliary drain was then replaced via the indwelling established track in the left bile ducts. Gentle hand injection of contrast demonstrated patency of the replaced drain with prompt antegrade flow of contrast into the duodenum. The catheter was affixed to the skin with an interrupted 0 silk suture. A sterile bandage was applied. The drain was placed to bag drainage. The patient tolerated the procedure well was transferred to the recovery area in good condition. IMPRESSION: 1. Cholangiogram significant for central left biliary stenosis. 2. Technically successful biliary brush biopsy about the central bile ducts. 3. Successful exchange and upsize of indwelling left biliary drain. PLAN: Keep biliary drain to bag drainage for now. Follow-up cytology results. If the  patient has sustained or increasing hyperbilirubinemia, recommend repeat CT abdomen with contrast to assess for excluded right intrahepatic ducts, which if present, would require additional right-sided biliary drain placement. Ruthann Cancer, MD [facility] Electronically Signed   By: Ruthann Cancer M.D.   On: 06/16/2022 10:23   IR ENDOLUMINAL BX OF BILIARY TREE  Result Date: 06/16/2022 INDICATION: 81 year old female with history of hilar cholangiocarcinoma status post pancreaticoduodenectomy in 2019 and more recently status post left-sided biliary drain placement on 06/02/2022. The patient presents for biliary brush biopsy. EXAM: 1. Cholangiogram via indwelling left biliary drain access 2. Biliary brush biopsy MEDICATIONS: 2 g cefoxitin; The antibiotic was administered within an appropriate time frame prior to the initiation of the procedure. ANESTHESIA/SEDATION: Moderate (conscious) sedation was employed during this procedure.  A total of Versed 1 mg and Fentanyl 50 mcg was administered intravenously. Moderate Sedation Time: 14 minutes. The patient's level of consciousness and vital signs were monitored continuously by radiology nursing throughout the procedure under my direct supervision. FLUOROSCOPY TIME:  Fluoroscopy Time: 2.1 minutes (63 mGy). CONTRAST:  16 mL Omnipaque 300, intrabiliary COMPLICATIONS: None immediate. PROCEDURE: Informed written consent was obtained from the patient after a thorough discussion of the procedural risks, benefits and alternatives. All questions were addressed. Maximal Sterile Barrier Technique was utilized including caps, mask, sterile gowns, sterile gloves, sterile drape, hand hygiene and skin antiseptic. A timeout was performed prior to the initiation of the procedure. The indwelling left biliary drain was prepped and draped in standard fashion. Hand injection of contrast demonstrated patency of the indwelling drain  with prompt antegrade flow through the terminus of the catheter into the third portion of the duodenum. There is no evidence of significant intrahepatic biliary ductal dilation. Subdermal Local anesthesia was administered at the planned suture site as well as along the subcutaneous tract of the indwelling drain with 1% lidocaine. The external portion of the catheter was cut to release the pigtail. An Amplatz wire was inserted and coiled in the fourth portion of the duodenum. The indwelling drain was then removed. Next, an 8 [place], 25 cm vascular sheath was placed over the drain in position with the tip in the duodenum. Pull-back sheath cholangiogram was then performed. Cholangiogram was significant for patent but stenotic common bile duct at the level of the hilum. There is moderate intrahepatic biliary ductal dilation of the central left biliary tree. No opacification of the right hepatic ducts were visualized. Next, biliary brush biopsy was performed about the hilum x3. Samples were placed on saline soaked Telfa and sent to cytology. After serial dilation, a 13 [place] biliary drain was then replaced via the indwelling established track in the left bile ducts. Gentle hand injection of contrast demonstrated patency of the replaced drain with prompt antegrade flow of contrast into the duodenum. The catheter was affixed to the skin with an interrupted 0 silk suture. A sterile bandage was applied. The drain was placed to bag drainage. The patient tolerated the procedure well was transferred to the recovery area in good condition. IMPRESSION: 1. Cholangiogram significant for central left biliary stenosis. 2. Technically successful biliary brush biopsy about the central bile ducts. 3. Successful exchange and upsize of indwelling left biliary drain. PLAN: Keep biliary drain to bag drainage for now. Follow-up cytology results. If the patient has sustained or increasing hyperbilirubinemia, recommend repeat CT abdomen with  contrast to assess for excluded right intrahepatic ducts, which if present, would require additional right-sided biliary drain placement. Ruthann Cancer, MD [facility] Electronically Signed   By: Ruthann Cancer M.D.   On: 06/16/2022 10:23    Treatments: Observation   Discharge Exam: Blood pressure 126/61, pulse 74, temperature 98.2 F (36.8 C), temperature source Oral, resp. rate 18, height '5\' 4"'$  (1.626 m), weight 228 lb 3.2 oz (103.5 kg), SpO2 93 %. Physical Exam Constitutional:      General: She is not in acute distress.    Appearance: She is obese. She is not ill-appearing.  HENT:     Mouth/Throat:     Mouth: Mucous membranes are moist.     Pharynx: Oropharynx is clear.  Cardiovascular:     Rate and Rhythm: Normal rate.  Pulmonary:     Effort: Pulmonary effort is normal.  Abdominal:  Palpations: Abdomen is soft.     Tenderness: There is no abdominal tenderness.     Comments: Mid/left biliary drain to gravity. Approximately 30 ml of bile in bag. Right upper quadrant biliary drain to gravity. Approximately 30 ml of bile in bag. Dressings to both sites are clean and dry.   Skin:    General: Skin is warm and dry.  Neurological:     Mental Status: She is alert and oriented to person, place, and time.     Disposition: Discharge disposition: 01-Home or Self Care   Allergies as of 07/03/2022   No Known Allergies      Medication List     TAKE these medications    amLODipine 10 MG tablet Commonly known as: NORVASC Take 10 mg by mouth daily.   bisoprolol-hydrochlorothiazide 5-6.25 MG tablet Commonly known as: ZIAC Take 1 tablet by mouth daily.   potassium chloride 10 MEQ tablet Commonly known as: KLOR-CON Take 1 tablet (10 mEq total) by mouth daily.   psyllium 58.6 % packet Commonly known as: METAMUCIL Take 1 packet by mouth daily as needed (constipation).   rosuvastatin 5 MG tablet Commonly known as:  CRESTOR Take 5 mg by mouth daily.   traMADol 50 MG tablet Commonly known as: ULTRAM Take 1 tablet (50 mg total) by mouth every 6 (six) hours as needed for moderate pain.        Follow-up Information     [facility] Follow up.   Why: Follow up in 6-8 weeks for drain evaluations/possible exchanges. A scheduler from our clinic will call you with a date/time. Please call our office with any questions/concerns prior to your visit. Contact information: [place] [address] [phone]                  Electronically Signed: Theresa Duty, NP 07/03/2022, 9:51 AM   I have spent Less Than 30 Minutes discharging [place].

## 2022-07-03 NOTE — Care Management Obs Status (Signed)
[facility] NOTIFICATION   Patient Details  Name: Barbara Byrd MRN: [id number] Date of Birth: Jul 29, 1941   Medicare Observation Status Notification Given:  Yes    Tom-Johnson, Renea Ee, RN 07/03/2022, 10:23 AM

## 2022-07-09 ENCOUNTER — Other Ambulatory Visit: Payer: Self-pay

## 2022-07-09 ENCOUNTER — Encounter: Payer: Self-pay | Admitting: Nurse Practitioner

## 2022-07-09 ENCOUNTER — Inpatient Hospital Stay: Payer: Medicare HMO | Admitting: Nurse Practitioner

## 2022-07-09 ENCOUNTER — Inpatient Hospital Stay: Payer: Medicare HMO | Attending: Oncology

## 2022-07-09 ENCOUNTER — Other Ambulatory Visit (HOSPITAL_BASED_OUTPATIENT_CLINIC_OR_DEPARTMENT_OTHER): Payer: Self-pay

## 2022-07-09 VITALS — BP 108/92 | HR 66 | Temp 98.1°F | Resp 18 | Ht 64.0 in | Wt 229.2 lb

## 2022-07-09 DIAGNOSIS — C240 Malignant neoplasm of extrahepatic bile duct: Secondary | ICD-10-CM | POA: Insufficient documentation

## 2022-07-09 DIAGNOSIS — C221 Intrahepatic bile duct carcinoma: Secondary | ICD-10-CM

## 2022-07-09 DIAGNOSIS — C7800 Secondary malignant neoplasm of unspecified lung: Secondary | ICD-10-CM | POA: Diagnosis present

## 2022-07-09 DIAGNOSIS — E785 Hyperlipidemia, unspecified: Secondary | ICD-10-CM | POA: Diagnosis not present

## 2022-07-09 DIAGNOSIS — I10 Essential (primary) hypertension: Secondary | ICD-10-CM | POA: Diagnosis not present

## 2022-07-09 LAB — CMP (CANCER CENTER ONLY)
ALT: 33 U/L (ref 0–44)
AST: 52 U/L — ABNORMAL HIGH (ref 15–41)
Albumin: 3.3 g/dL — ABNORMAL LOW (ref 3.5–5.0)
Alkaline Phosphatase: 506 U/L — ABNORMAL HIGH (ref 38–126)
Anion gap: 11 (ref 5–15)
BUN: 13 mg/dL (ref 8–23)
CO2: 29 mmol/L (ref 22–32)
Calcium: 9.6 mg/dL (ref 8.9–10.3)
Chloride: 97 mmol/L — ABNORMAL LOW (ref 98–111)
Creatinine: 0.99 mg/dL (ref 0.44–1.00)
GFR, Estimated: 58 mL/min — ABNORMAL LOW (ref >60)
Glucose, Bld: 186 mg/dL — ABNORMAL HIGH (ref 70–99)
Potassium: 3.5 mmol/L (ref 3.5–5.1)
Sodium: 137 mmol/L (ref 135–145)
Total Bilirubin: 7.8 mg/dL (ref 0.3–1.2)
Total Protein: 7.1 g/dL (ref 6.5–8.1)

## 2022-07-09 MED ORDER — POTASSIUM CHLORIDE ER 10 MEQ PO TBCR
10.0000 meq | EXTENDED_RELEASE_TABLET | Freq: Every day | ORAL | 2 refills | Status: DC
  Filled 2022-07-09: qty 30, 30d supply, fill #0

## 2022-07-09 NOTE — Progress Notes (Signed)
[facility] OFFICE PROGRESS NOTE   Diagnosis: Cholangiocarcinoma  INTERVAL HISTORY:   Barbara Byrd returns as scheduled.  She overall is feeling better.  Main complaint is pain related to the new drain.  Bowels are moving.  No nausea or vomiting.  Appetite is better.  She notes an odor which she feels is related to the output from the drains.  No fever or chills.  Objective:  Vital signs in last 24 hours:  Blood pressure (!) 108/92, pulse 66, temperature 98.1 F (36.7 C), temperature source Oral, resp. rate 18, height '5\' 4"'$  (1.626 m), weight 229 lb 3.2 oz (104 kg), SpO2 100 %.    HEENT: No thrush or ulcers.  Scleral icterus. Resp: Lungs clear bilaterally. Cardio: Regular rate and rhythm. GI: Bilateral biliary drainage catheters.  Catheter on the right side has brown/green drainage at the site. Vascular: No leg edema. Neuro: Alert and oriented.    Lab Results:  Lab Results  Component Value Date   WBC 7.8 07/02/2022   HGB 10.9 (L) 07/02/2022   HCT 33.2 (L) 07/02/2022   MCV 88.1 07/02/2022   PLT 266 07/02/2022   NEUTROABS 5.3 07/02/2022    Imaging:  No results found.  Medications: I have reviewed the patient's current medications.  Assessment/Plan: Common bile duct cholangiocarcinoma-stage IIb,pT2pN1 Presenting with obstructive jaundice summer 2019 Elevated preoperative CA 19-9 ERCP/EUS 09/02/2018-stenosis and lower third of the main bile duct, mass in the uncinate of the pancreas,-biopsy revealed minute focus of atypical cells Pancreaticoduodenectomy 11/15/2018,pT2pN1 moderately differentiated adenocarcinoma of the common bile duct, 2/35 lymph nodes, perineural invasion Patient reports 6 months of adjuvant gemcitabine/capecitabine Seen with complaint of abdominal pain 02/23/2022, CA 19-9 elevated, referred for CTs CTs 02/27/2022 status post Whipple without specific evidence of recurrent or metastatic disease.  Tiny pulmonary nodules not readily apparent  on the outside CT of 07/27/2019.  No thoracic adenopathy. Enlarged heterogeneous left lobe of the thyroid grossly similar to the prior exam. PET 2/95/6213-YQMVHQIONG hypermetabolic liver lesions consistent with metastases, mild uptake involving a right common iliac lymph node MRI abdomen 05/17/2022-lesions in the right liver noted on PET are not well defined on MRI, left and right intrahepatic biliary dilatation, suggesting obstruction at the confluence of the left and right hepatic ducts, hypoenhancing lesion at this level measuring 10 x 12 mm.  Central hypermetabolic activity in this region on the PET, no evidence of nodal disease 06/02/2022 cholangiogram and placement of a biliary drain by interventional radiology-central intrahepatic biliary dilatation but minimal peripheral biliary dilatation; right hepatic lobe demonstrated irregular dilated central bile ducts with drainage through the surgical anastomosis into the small bowel; narrowing at the surgical anastomosis; dilated left hepatic bile duct demonstrated filling defects within the bile duct; contrast eventually drained into the small bowel through the hepaticojejunostomy.  Cytology on the biliary tract, bile, biliary drain suspicious for malignancy. 06/15/2022-cholangiogram confirmed central left biliary stenosis, biliary brush biopsy-adenocarcinoma, left biliary drain exchanged and upsized  CT abdomen/pelvis 06/27/2022-numerous small pulmonary nodules in the included bilateral lung bases which are new and enlarged compared to exam dated 02/27/2022.  Interval placement of left-sided percutaneous biliary drain.  Similar intrahepatic biliary ductal dilatation when compared to prior MRI most notable left lobe of the liver. 07/02/2022 biliary drain exchanged on the left and new drain placed on the right 2.  Hypertension 3.  Hyperlipidemia 4.  COVID-19 2021 5.  06/02/2022 admitted with rigors, bradycardia, hypotension, nausea/vomiting following placement of  the biliary drain, likely acute cholangitis, blood culture  positive for E. coli, discharged home on levofloxacin      Disposition: Ms. Belli appears unchanged.  She underwent exchange of the left biliary drain and placement of a new drain on the right on 07/02/2022.  Bilirubin is improved today.  We reviewed the CT abdomen/pelvis done 06/27/2022 with Ms. Desaulniers and her sister.  We discussed the small lung nodules likely representing metastatic disease.  We discussed systemic therapy in the future.  She understands the bilirubin needs to be lower before initiating treatment.  She will return for lab and follow-up in 2 to 3 weeks.  We are available to see her sooner if needed.  She will continue follow-up in the drain clinic.  Patient seen with Dr. Benay Spice.   Ned Card ANP/GNP-BC   07/09/2022  9:37 AM This was a shared visit with Ned Card.  We reviewed recent CT images with Ms. Surratt and her sister.  She appears to have metastatic disease involving lung nodules.  The liver enzymes and bilirubin are partially improved following placement of a new biliary drain.  We will consider systemic therapy when the bilirubin has further improved.  I was present for greater than 50% of today's visit.  I performed medical decision making.  Julieanne Manson, MD

## 2022-07-09 NOTE — Progress Notes (Signed)
CRITICAL VALUE STICKER  CRITICAL VALUE: T. Bili= 7.8  RECEIVER (on-site recipient of call):Kamaiya Antilla,RN  DATE & TIME NOTIFIED: 07/09/22 @@ 0953  MESSENGER (representative from lab):Otila Kluver  MD NOTIFIED: Ned Card, NP  TIME OF NOTIFICATION: [phone]  RESPONSE: Currently with patient

## 2022-07-13 ENCOUNTER — Encounter: Payer: Self-pay | Admitting: Podiatry

## 2022-07-13 ENCOUNTER — Ambulatory Visit: Payer: Medicare HMO | Admitting: Podiatry

## 2022-07-13 DIAGNOSIS — M79675 Pain in left toe(s): Secondary | ICD-10-CM | POA: Diagnosis not present

## 2022-07-13 DIAGNOSIS — M79674 Pain in right toe(s): Secondary | ICD-10-CM | POA: Diagnosis not present

## 2022-07-13 DIAGNOSIS — B351 Tinea unguium: Secondary | ICD-10-CM | POA: Diagnosis not present

## 2022-07-13 NOTE — Progress Notes (Signed)
This patient presents to the office for evaluation and treatment of long thick painful nails .  This patient is unable to trim her own nails since the patient cannot reach her feet.  Patient says the nails are painful walking and wearing his shoes.  She presents for preventive foot care services.  General Appearance  Alert, conversant and in no acute stress.  Vascular  Dorsalis pedis and posterior tibial  pulses are palpable  bilaterally.  Capillary return is within normal limits  bilaterally. Temperature is within normal limits  bilaterally.  Neurologic  Senn-Weinstein monofilament wire test within normal limits  bilaterally. Muscle power within normal limits bilaterally.  Nails Thick disfigured discolored nails with subungual debris  from hallux to fifth toes bilaterally. No evidence of bacterial infection or drainage bilaterally.  Orthopedic  No limitations of motion  feet .  No crepitus or effusions noted.  No bony pathology or digital deformities noted.  Skin  normotropic skin with no porokeratosis noted bilaterally.  No signs of infections or ulcers noted.     Onychomycosis  Pain in toes right foot  Pain in toes left foot  Debridement  of nails  1-5  B/L with a nail nipper.  Nails were then filed using a dremel tool with no incidents.    RTC  3 months    Gardiner Barefoot DPM

## 2022-07-22 ENCOUNTER — Other Ambulatory Visit: Payer: Self-pay

## 2022-07-22 DIAGNOSIS — C221 Intrahepatic bile duct carcinoma: Secondary | ICD-10-CM

## 2022-07-23 ENCOUNTER — Inpatient Hospital Stay: Payer: Medicare HMO | Admitting: Oncology

## 2022-07-23 ENCOUNTER — Other Ambulatory Visit (HOSPITAL_COMMUNITY): Payer: Self-pay | Admitting: Oncology

## 2022-07-23 ENCOUNTER — Telehealth (HOSPITAL_COMMUNITY): Payer: Self-pay

## 2022-07-23 ENCOUNTER — Inpatient Hospital Stay: Payer: Medicare HMO

## 2022-07-23 VITALS — BP 118/70 | HR 70 | Temp 98.2°F | Resp 18 | Ht 64.0 in | Wt 224.0 lb

## 2022-07-23 DIAGNOSIS — C221 Intrahepatic bile duct carcinoma: Secondary | ICD-10-CM | POA: Diagnosis not present

## 2022-07-23 DIAGNOSIS — C240 Malignant neoplasm of extrahepatic bile duct: Secondary | ICD-10-CM | POA: Diagnosis not present

## 2022-07-23 LAB — CBC WITH DIFFERENTIAL (CANCER CENTER ONLY)
Abs Immature Granulocytes: 0.11 10*3/uL — ABNORMAL HIGH (ref 0.00–0.07)
Basophils Absolute: 0.1 10*3/uL (ref 0.0–0.1)
Basophils Relative: 1 %
Eosinophils Absolute: 0.1 10*3/uL (ref 0.0–0.5)
Eosinophils Relative: 1 %
HCT: 36.6 % (ref 36.0–46.0)
Hemoglobin: 11.9 g/dL — ABNORMAL LOW (ref 12.0–15.0)
Immature Granulocytes: 1 %
Lymphocytes Relative: 18 %
Lymphs Abs: 1.8 10*3/uL (ref 0.7–4.0)
MCH: 28.3 pg (ref 26.0–34.0)
MCHC: 32.5 g/dL (ref 30.0–36.0)
MCV: 87.1 fL (ref 80.0–100.0)
Monocytes Absolute: 0.9 10*3/uL (ref 0.1–1.0)
Monocytes Relative: 9 %
Neutro Abs: 6.8 10*3/uL (ref 1.7–7.7)
Neutrophils Relative %: 70 %
Platelet Count: 366 10*3/uL (ref 150–400)
RBC: 4.2 MIL/uL (ref 3.87–5.11)
RDW: 15.8 % — ABNORMAL HIGH (ref 11.5–15.5)
WBC Count: 9.7 10*3/uL (ref 4.0–10.5)
nRBC: 0 % (ref 0.0–0.2)

## 2022-07-23 LAB — CMP (CANCER CENTER ONLY)
ALT: 32 U/L (ref 0–44)
AST: 39 U/L (ref 15–41)
Albumin: 2.9 g/dL — ABNORMAL LOW (ref 3.5–5.0)
Alkaline Phosphatase: 387 U/L — ABNORMAL HIGH (ref 38–126)
Anion gap: 10 (ref 5–15)
BUN: 16 mg/dL (ref 8–23)
CO2: 27 mmol/L (ref 22–32)
Calcium: 9.2 mg/dL (ref 8.9–10.3)
Chloride: 100 mmol/L (ref 98–111)
Creatinine: 0.94 mg/dL (ref 0.44–1.00)
GFR, Estimated: >60 mL/min (ref >60)
Glucose, Bld: 151 mg/dL — ABNORMAL HIGH (ref 70–99)
Potassium: 3.4 mmol/L — ABNORMAL LOW (ref 3.5–5.1)
Sodium: 137 mmol/L (ref 135–145)
Total Bilirubin: 6.5 mg/dL (ref 0.3–1.2)
Total Protein: 7.3 g/dL (ref 6.5–8.1)

## 2022-07-23 MED ORDER — TRAMADOL HCL 50 MG PO TABS: 50.0000 mg | ORAL_TABLET | Freq: Four times a day (QID) | ORAL | 0 refills | Status: DC | PRN

## 2022-07-23 NOTE — Progress Notes (Signed)
[facility] OFFICE PROGRESS NOTE   Diagnosis: Cholangiocarcinoma  INTERVAL HISTORY:   Ms. Haser returns as scheduled.  She continues to feel better following placement of the biliary drains.  She has discomfort at the right drain site.  She takes tramadol a few times daily.  No fever.  She reports intermittent pruritus.  Objective:  Vital signs in last 24 hours:  Blood pressure 118/70, pulse 70, temperature 98.2 F (36.8 C), temperature source Oral, resp. rate 18, height '5\' 4"'$  (1.626 m), weight 224 lb (101.6 kg), SpO2 100 %.    HEENT: Scleral icterus Resp: Lungs clear bilaterally Cardio: Regular rate and rhythm GI: Right and left upper abdomen sites without erythema.  There is bilious discharge surrounding the right catheter Vascular: No leg edema  Lab Results:  Lab Results  Component Value Date   WBC 9.7 07/23/2022   HGB 11.9 (L) 07/23/2022   HCT 36.6 07/23/2022   MCV 87.1 07/23/2022   PLT 366 07/23/2022   NEUTROABS 6.8 07/23/2022    CMP  Lab Results  Component Value Date   NA 137 07/23/2022   K 3.4 (L) 07/23/2022   CL 100 07/23/2022   CO2 27 07/23/2022   GLUCOSE 151 (H) 07/23/2022   BUN 16 07/23/2022   CREATININE 0.94 07/23/2022   CALCIUM 9.2 07/23/2022   PROT 7.3 07/23/2022   ALBUMIN 2.9 (L) 07/23/2022   AST 39 07/23/2022   ALT 32 07/23/2022   ALKPHOS 387 (H) 07/23/2022   BILITOT 6.5 (HH) 07/23/2022   GFRNONAA >60 07/23/2022    Lab Results  Component Value Date   PYK998 935 (H) 04/20/2022     Medications: I have reviewed the patient's current medications.   Assessment/Plan: Common bile duct cholangiocarcinoma-stage IIb,pT2pN1 Presenting with obstructive jaundice summer 2019 Elevated preoperative CA 19-9 ERCP/EUS 09/02/2018-stenosis and lower third of the main bile duct, mass in the uncinate of the pancreas,-biopsy revealed minute focus of atypical cells Pancreaticoduodenectomy 11/15/2018,pT2pN1 moderately differentiated  adenocarcinoma of the common bile duct, 2/35 lymph nodes, perineural invasion Patient reports 6 months of adjuvant gemcitabine/capecitabine Seen with complaint of abdominal pain 02/23/2022, CA 19-9 elevated, referred for CTs CTs 02/27/2022 status post Whipple without specific evidence of recurrent or metastatic disease.  Tiny pulmonary nodules not readily apparent on the outside CT of 07/27/2019.  No thoracic adenopathy. Enlarged heterogeneous left lobe of the thyroid grossly similar to the prior exam. PET 3/38/2505-LZJQBHALPF hypermetabolic liver lesions consistent with metastases, mild uptake involving a right common iliac lymph node MRI abdomen 05/17/2022-lesions in the right liver noted on PET are not well defined on MRI, left and right intrahepatic biliary dilatation, suggesting obstruction at the confluence of the left and right hepatic ducts, hypoenhancing lesion at this level measuring 10 x 12 mm.  Central hypermetabolic activity in this region on the PET, no evidence of nodal disease 06/02/2022 cholangiogram and placement of a biliary drain by interventional radiology-central intrahepatic biliary dilatation but minimal peripheral biliary dilatation; right hepatic lobe demonstrated irregular dilated central bile ducts with drainage through the surgical anastomosis into the small bowel; narrowing at the surgical anastomosis; dilated left hepatic bile duct demonstrated filling defects within the bile duct; contrast eventually drained into the small bowel through the hepaticojejunostomy.  Cytology on the biliary tract, bile, biliary drain suspicious for malignancy. 06/15/2022-cholangiogram confirmed central left biliary stenosis, biliary brush biopsy-adenocarcinoma, left biliary drain exchanged and upsized  CT abdomen/pelvis 06/27/2022-numerous small pulmonary nodules in the included bilateral lung bases which are new and enlarged compared  to exam dated 02/27/2022.  Interval placement of left-sided percutaneous  biliary drain.  Similar intrahepatic biliary ductal dilatation when compared to prior MRI most notable left lobe of the liver. 07/02/2022 biliary drain exchanged on the left and new drain placed on the right 2.  Hypertension 3.  Hyperlipidemia 4.  COVID-19 2021 5.  06/02/2022 admitted with rigors, bradycardia, hypotension, nausea/vomiting following placement of the biliary drain, likely acute cholangitis, blood culture positive for E. 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  She has decompressive biliary drains in place.  The bilirubin remains elevated.  There is discharge surrounding the right upper abdomen drain site.  We will refer her to interventional radiology to evaluate the drains.  I refilled her prescription for tramadol.  We discussed systemic treatment options.  I do not recommend beginning systemic therapy until the bilirubin has further improved.  We submitted peripheral blood today for next generation sequencing.  Ms. Pineau will return for an office visit in 2 weeks.  [name], MD  07/23/2022  1:24 PM

## 2022-07-23 NOTE — Telephone Encounter (Signed)
Called to give instructions for drain placement on 8/22, no answer, left vm. AW

## 2022-07-23 NOTE — Progress Notes (Signed)
CRITICAL VALUE STICKER  CRITICAL VALUE: Total bili=6.5   RECEIVER (on-site recipient of call):Royalty Fakhouri, RN  DATE & TIME NOTIFIED: 07/23/22 @ 1017  MESSENGER (representative from lab): Otila Kluver  MD NOTIFIED: Dr. Benay Spice  TIME OF NOTIFICATION: 07/23/22 @ 1020  RESPONSE: Call to IR to see patient re: drains.

## 2022-07-24 ENCOUNTER — Telehealth (HOSPITAL_COMMUNITY): Payer: Self-pay | Admitting: Radiology

## 2022-07-24 NOTE — Telephone Encounter (Signed)
Called pt's sister, [name]. Gave her all of the instructions for Barbara Byrd's upcoming procedure on 07/28/22. Sister expresses understanding and is in agreement with this plan of care. JM

## 2022-07-27 ENCOUNTER — Other Ambulatory Visit: Payer: Self-pay | Admitting: Internal Medicine

## 2022-07-27 DIAGNOSIS — C221 Intrahepatic bile duct carcinoma: Secondary | ICD-10-CM

## 2022-07-28 ENCOUNTER — Other Ambulatory Visit: Payer: Self-pay

## 2022-07-28 ENCOUNTER — Other Ambulatory Visit (HOSPITAL_COMMUNITY): Payer: Self-pay | Admitting: Oncology

## 2022-07-28 ENCOUNTER — Observation Stay (HOSPITAL_COMMUNITY)
Admission: RE | Admit: 2022-07-28 | Discharge: 2022-07-29 | Disposition: A | Discharge status: Home / Self Care | Payer: Medicare HMO | Source: Ambulatory Visit | Attending: Interventional Radiology | Admitting: Interventional Radiology

## 2022-07-28 ENCOUNTER — Encounter (HOSPITAL_COMMUNITY): Payer: Self-pay

## 2022-07-28 DIAGNOSIS — C221 Intrahepatic bile duct carcinoma: Secondary | ICD-10-CM

## 2022-07-28 DIAGNOSIS — Z79899 Other long term (current) drug therapy: Secondary | ICD-10-CM | POA: Insufficient documentation

## 2022-07-28 DIAGNOSIS — Z9689 Presence of other specified functional implants: Secondary | ICD-10-CM | POA: Diagnosis not present

## 2022-07-28 DIAGNOSIS — Z4682 Encounter for fitting and adjustment of non-vascular catheter: Secondary | ICD-10-CM | POA: Insufficient documentation

## 2022-07-28 HISTORY — PX: IR EXCHANGE BILIARY DRAIN: IMG6046

## 2022-07-28 HISTORY — PX: IR INT EXT BILIARY DRAIN WITH CHOLANGIOGRAM: IMG6044

## 2022-07-28 HISTORY — PX: IR BILIARY DRAIN PLACEMENT WITH CHOLANGIOGRAM: IMG6043

## 2022-07-28 LAB — CBC WITH DIFFERENTIAL/PLATELET
Abs Immature Granulocytes: 0.07 10*3/uL (ref 0.00–0.07)
Basophils Absolute: 0 10*3/uL (ref 0.0–0.1)
Basophils Relative: 0 %
Eosinophils Absolute: 0 10*3/uL (ref 0.0–0.5)
Eosinophils Relative: 0 %
HCT: 41.2 % (ref 36.0–46.0)
Hemoglobin: 13.2 g/dL (ref 12.0–15.0)
Immature Granulocytes: 1 %
Lymphocytes Relative: 17 %
Lymphs Abs: 1.3 10*3/uL (ref 0.7–4.0)
MCH: 28 pg (ref 26.0–34.0)
MCHC: 32 g/dL (ref 30.0–36.0)
MCV: 87.5 fL (ref 80.0–100.0)
Monocytes Absolute: 0.8 10*3/uL (ref 0.1–1.0)
Monocytes Relative: 10 %
Neutro Abs: 5.4 10*3/uL (ref 1.7–7.7)
Neutrophils Relative %: 72 %
Platelets: 257 10*3/uL (ref 150–400)
RBC: 4.71 MIL/uL (ref 3.87–5.11)
RDW: 15.7 % — ABNORMAL HIGH (ref 11.5–15.5)
WBC: 7.6 10*3/uL (ref 4.0–10.5)
nRBC: 0 % (ref 0.0–0.2)

## 2022-07-28 LAB — COMPREHENSIVE METABOLIC PANEL
ALT: 40 U/L (ref 0–44)
AST: 59 U/L — ABNORMAL HIGH (ref 15–41)
Albumin: 2.1 g/dL — ABNORMAL LOW (ref 3.5–5.0)
Alkaline Phosphatase: 450 U/L — ABNORMAL HIGH (ref 38–126)
Anion gap: 11 (ref 5–15)
BUN: 14 mg/dL (ref 8–23)
CO2: 24 mmol/L (ref 22–32)
Calcium: 8.8 mg/dL — ABNORMAL LOW (ref 8.9–10.3)
Chloride: 101 mmol/L (ref 98–111)
Creatinine, Ser: 1.11 mg/dL — ABNORMAL HIGH (ref 0.44–1.00)
GFR, Estimated: 51 mL/min — ABNORMAL LOW (ref >60)
Glucose, Bld: 130 mg/dL — ABNORMAL HIGH (ref 70–99)
Potassium: 4 mmol/L (ref 3.5–5.1)
Sodium: 136 mmol/L (ref 135–145)
Total Bilirubin: 5.3 mg/dL — ABNORMAL HIGH (ref 0.3–1.2)
Total Protein: 7.2 g/dL (ref 6.5–8.1)

## 2022-07-28 LAB — PROTIME-INR
INR: 1.3 — ABNORMAL HIGH (ref 0.8–1.2)
Prothrombin Time: 16.1 seconds — ABNORMAL HIGH (ref 11.4–15.2)

## 2022-07-28 MED ORDER — BISOPROLOL-HYDROCHLOROTHIAZIDE 5-6.25 MG PO TABS
1.0000 | ORAL_TABLET | Freq: Every day | ORAL | Status: DC
Start: 2022-07-29 — End: 2022-07-29
  Administered 2022-07-29: 1 via ORAL
  Filled 2022-07-28: qty 1

## 2022-07-28 MED ORDER — FENTANYL CITRATE (PF) 100 MCG/2ML IJ SOLN
INTRAMUSCULAR | Status: AC | PRN
  Administered 2022-07-28: 50 ug via INTRAVENOUS
  Administered 2022-07-28 (×2): 25 ug via INTRAVENOUS

## 2022-07-28 MED ORDER — MIDAZOLAM HCL 2 MG/2ML IJ SOLN
INTRAMUSCULAR | Status: AC
  Filled 2022-07-28: qty 2

## 2022-07-28 MED ORDER — TRAMADOL HCL 50 MG PO TABS
50.0000 mg | ORAL_TABLET | Freq: Four times a day (QID) | ORAL | Status: DC | PRN
  Administered 2022-07-28 (×2): 50 mg via ORAL
  Filled 2022-07-28 (×2): qty 1

## 2022-07-28 MED ORDER — PSYLLIUM 95 % PO PACK: 1.0000 | PACK | Freq: Every day | ORAL | Status: DC | PRN

## 2022-07-28 MED ORDER — ROSUVASTATIN CALCIUM 5 MG PO TABS
5.0000 mg | ORAL_TABLET | Freq: Every day | ORAL | Status: DC
  Administered 2022-07-29: 5 mg via ORAL
  Filled 2022-07-28: qty 1

## 2022-07-28 MED ORDER — LIDOCAINE HCL 1 % IJ SOLN
INTRAMUSCULAR | Status: AC
  Filled 2022-07-28: qty 20

## 2022-07-28 MED ORDER — SODIUM CHLORIDE 0.9 % IV SOLN: INTRAVENOUS | Status: DC

## 2022-07-28 MED ORDER — MIDAZOLAM HCL 2 MG/2ML IJ SOLN
INTRAMUSCULAR | Status: AC | PRN
  Administered 2022-07-28: .5 mg via INTRAVENOUS
  Administered 2022-07-28: 1 mg via INTRAVENOUS
  Administered 2022-07-28: .5 mg via INTRAVENOUS

## 2022-07-28 MED ORDER — IOHEXOL 300 MG/ML  SOLN
100.0000 mL | Freq: Once | INTRAMUSCULAR | Status: AC | PRN
  Administered 2022-07-28: 50 mL

## 2022-07-28 MED ORDER — SODIUM CHLORIDE 0.9% FLUSH
5.0000 mL | Freq: Three times a day (TID) | INTRAVENOUS | Status: DC
  Administered 2022-07-28 – 2022-07-29 (×3): 5 mL

## 2022-07-28 MED ORDER — FENTANYL CITRATE (PF) 100 MCG/2ML IJ SOLN
INTRAMUSCULAR | Status: AC
  Filled 2022-07-28: qty 2

## 2022-07-28 MED ORDER — AMLODIPINE BESYLATE 10 MG PO TABS
10.0000 mg | ORAL_TABLET | Freq: Every day | ORAL | Status: DC
  Filled 2022-07-28: qty 1

## 2022-07-28 MED ORDER — SODIUM CHLORIDE 0.9 % IV SOLN: INTRAVENOUS | Status: AC

## 2022-07-28 MED ORDER — SODIUM CHLORIDE 0.9 % IV SOLN
2.0000 g | INTRAVENOUS | Status: AC
  Administered 2022-07-28: 2 g via INTRAVENOUS
  Filled 2022-07-28 (×2): qty 2

## 2022-07-28 NOTE — Procedures (Signed)
Interventional Radiology Procedure Note  Procedure:   Cholangiogram of left and right drain access.  US guided placement of new right biliary int/ext drain.  Exchange of prior left and right int/ext biliary drain.   Findings: The cholangiogram from the prior L & R drains show no communication to the undrained ducts.   New drain into the duodenum.    Complications: None  Recommendations:  - Ok to advance diet - 23 hr obs is planned, VIR admission - 3 drains to gravity - Do not submerge    Signed,  Dulcy Fanny. Earleen Newport, DO

## 2022-07-28 NOTE — Progress Notes (Signed)
NEW ADMISSION NOTE New Admission Note:   Arrival Method: stretcher Mental Orientation:  A & O x 4 Telemetry: box 3 Assessment: Completed Skin: intact IV: Right Forearm infusing Pain: 8 on 1-10 scale right abd Tubes: see LDA Safety Measures: Safety Fall Prevention Plan has been given, discussed and signed Admission: Completed [facility] Orientation: Patient has been orientated to the room, unit and staff.  Family: none present  Orders have been reviewed and implemented. Will continue to monitor the patient. Call light has been placed within reach and bed alarm has been activated.   Berneta Levins, RN

## 2022-07-28 NOTE — Plan of Care (Signed)
  Problem: Clinical Measurements: Goal: Respiratory complications will improve Outcome: Progressing Goal: Cardiovascular complication will be avoided Outcome: Progressing   Problem: Nutrition: Goal: Adequate nutrition will be maintained Outcome: Progressing   

## 2022-07-28 NOTE — H&P (Signed)
Chief Complaint: Patient was seen in consultation today for Internal/external Biliary drain exchange/placement at the request of Dr. Benay Spice.  Supervising Physician: Dr. Earleen Newport  Patient Status: [facility] - Out-pt  History of Present Illness: Barbara Byrd is a 81 y.o. female   Known Cholangiocarcinoma Ductal Dilatation Underwent biliary drain with IR on 06/02/22 At 06/11/22 appt with Oncology, bilirubin continuing to rise On 7/27, underwent exchange of left I/R drain and placement of new right I/E drain. Bili continues to remain elevated and there is some drainage from around the tubes. Pt is scheduled for cholangiogram and possible placement of additional biliary drain  Had H&P within last 30 days and patient denies any changes to health history, medications, or symptomatology.  Reports feeling well the past two days.  Past Medical History:  Diagnosis Date   Family history of cancer of pituitary gland and craniopharyngeal duct    Family history of prostate cancer     Past Surgical History:  Procedure Laterality Date   IR BILIARY DRAIN PLACEMENT WITH CHOLANGIOGRAM  07/02/2022   IR CHOLANGIOGRAM EXISTING TUBE  06/15/2022   IR ENDOLUMINAL BX OF BILIARY TREE  06/15/2022   IR EXCHANGE BILIARY DRAIN  07/02/2022   IR INT EXT BILIARY DRAIN WITH CHOLANGIOGRAM  06/02/2022   IR REMOVAL TUN ACCESS W/ PORT W/O FL MOD SED  11/14/2021    Allergies: Patient has no known allergies.  Medications: Prior to Admission medications   Medication Sig Start Date End Date Taking? Authorizing Provider  amLODipine (NORVASC) 10 MG tablet Take 10 mg by mouth daily. 04/15/21   [provider]  bisoprolol-hydrochlorothiazide (ZIAC) 5-6.25 MG tablet Take 1 tablet by mouth daily. 04/15/21   [provider]  potassium chloride (KLOR-CON) 10 MEQ tablet Take 1 tablet (10 mEq total) by mouth daily. 04/20/22   Owens Shark, NP  rosuvastatin (CRESTOR) 5 MG tablet Take 5 mg by mouth daily. 04/15/21    [provider]  traMADol (ULTRAM) 50 MG tablet Take 1 tablet (50 mg total) by mouth every 6 (six) hours as needed for moderate pain. 05/14/22   Owens Shark, NP     Family History  Problem Relation Age of Onset   Heart disease Mother 48   Cerebral aneurysm Father 53   Brain cancer Brother 73       pituitary tumor   Stroke Maternal Grandmother    Brain cancer Nephew 38       pituitary gland   Prostate cancer Nephew 31    Social History   Socioeconomic History   Marital status: Single    Spouse name: Not on file   Number of children: Not on file   Years of education: Not on file   Highest education level: Not on file  Occupational History   Not on file  Tobacco Use   Smoking status: Never   Smokeless tobacco: Not on file  Vaping Use   Vaping Use: Never used  Substance and Sexual Activity   Alcohol use: Yes    Comment: socially   Drug use: Never   Sexual activity: Not on file  Other Topics Concern   Not on file  Social History Narrative   Not on file   Social Determinants of Health   Financial Resource Strain: Not on file  Food Insecurity: Not on file  Transportation Needs: Not on file  Physical Activity: Not on file  Stress: Not on file  Social Connections: Not on file   Review of Systems  Constitutional: Negative.   Eyes: Negative.  Negative for visual disturbance.  Respiratory: Negative.  Negative for cough and shortness of breath.   Cardiovascular: Negative.  Negative for chest pain.  Gastrointestinal:  Negative for abdominal pain and nausea.  Endocrine: Negative.   Genitourinary: Negative.   Musculoskeletal: Negative.   Skin: Negative.   Allergic/Immunologic: Negative.   Neurological:  Positive for weakness.  Psychiatric/Behavioral:  Negative for behavioral problems and confusion.     Vital Signs: BP 127/63 (BP Location: Right Arm)   Pulse 61   Resp 16   Ht '5\' 4"'$  (1.626 m)   Wt 224 lb (101.6 kg)   SpO2 96%   BMI 38.45 kg/m      Physical Exam Vitals reviewed.  HENT:     Mouth/Throat:     Mouth: Mucous membranes are moist.  Eyes:     General: Scleral icterus present.  Cardiovascular:     Rate and Rhythm: Normal rate and regular rhythm.     Heart sounds: Normal heart sounds.  Pulmonary:     Effort: Pulmonary effort is normal.     Breath sounds: Normal breath sounds. No wheezing.  Abdominal:     Palpations: Abdomen is soft.     Comments: Right and Left biliary drains intact, sites clean. Bilious output  Musculoskeletal:        General: Normal range of motion.  Skin:    General: Skin is warm.  Neurological:     Mental Status: She is alert and oriented to person, place, and time.  Psychiatric:        Behavior: Behavior normal.     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 She presents for exchange and repositioning of existing left biliary drain as well as attempted placement of a right-sided biliary drainage catheter for persistent hyperbilirubinemia. EXAM: 1. Percutaneous transhepatic cholangiogram through existing access 2. Successful placement of a new 2 French right-sided internal/external biliary drainage catheter. 3. Exchange of existing left 12 French internal/external biliary drain. MEDICATIONS: 2 g Mefoxin; The antibiotic was administered within an appropriate time frame prior to the initiation of the procedure. ANESTHESIA/SEDATION: Moderate (conscious) sedation was employed during this procedure. A total of Versed 1.5 mg and Fentanyl 50 mcg was administered intravenously by the radiology nurse. Total intra-service moderate Sedation Time: 54 minutes. The patient's level of consciousness and vital signs were monitored continuously by radiology nursing throughout the procedure under my direct supervision. FLUOROSCOPY: Radiation Exposure  Index (as provided by the fluoroscopic device): 341 mGy Kerma COMPLICATIONS: None immediate. PROCEDURE: Informed written consent was obtained from the patient after a thorough discussion of the procedural risks, benefits and alternatives. All questions were addressed. Maximal Sterile Barrier Technique was utilized including caps, mask, sterile gowns, sterile gloves, sterile drape, hand hygiene and skin antiseptic. A timeout was performed prior to the initiation of the procedure. An initial contrast injection was performed through the existing percutaneous biliary drainage catheter. This is a left approach biliary drainage catheter. The tip is pulled back slightly but remains just within the small bowel. Unfortunately, no significant contrast material opacified the ductal system. There is preferential flow into the small bowel. Therefore, the tube was transected and removed over a wire. An 8 French sheath was advanced over the wire and into the left intrahepatic ducts. Additional cholangiography was performed. Only the left-sided ducts opacify. There is complete occlusion of the ductal system  in the region of the intrahepatic confluence. Maintaining wire access through the obstruction and into the duodenum, an angled catheter was advanced coaxially through the drain. With the assistance of a Glidewire, the right-sided ductal system was successfully catheterized. Additional cholangiography was then performed through the catheter. There is a stellate obstructing mass at the intrahepatic biliary ductal confluence. Unfortunately, numerous ducts exist as isolated and obstructed segments. The anterior and posterior ductal systems are completely separate and do not communicate. Additionally, the posterior ductal segments are also divided into at least 2 separate segments. It appears that the segment 6 ducts are most approachable for percutaneous access and may provide the most significant drainage. Therefore, a suitable  skin entry site was selected and marked. Local anesthesia was attained by infiltration with 1% lidocaine. A 22 gauge Chiba needle was then carefully advanced under fluoroscopic guidance and used to puncture a tertiary radical of the right posterior ductal system. A 0.018 Nitrex wire was advanced into the hepatic hilum. The Accustick sheath was carefully advanced over the wire. A Glidewire was then successfully navigated through the obstructed portion of the right hepatic duct and into the small bowel. The glidewire was then exchanged for a superstiff Amplatz wire. The percutaneous tract was dilated to 10 [place]. A Cook 46 [place] biliary drainage catheter was then advanced over the wire and positioned such that the sideholes extended from the entry into the right posterior ducts through the obstructed segment and into the small bowel. Next, attention was returned to the existing left-sided duct. A new 12 [place] cook biliary drainage catheter was advanced over the wire and formed with the pigtail loop in the small bowel. Both drains were secured to the skin with 0 Prolene suture and connected to gravity bag drainage. Images were obtained and stored for the medical record. IMPRESSION: 1. Stellate mass resulting in multifocal obstruction at the confluence of the intrahepatic ducts. The left and right ductal systems are completely separate, and unfortunately, the right system is separated into at least 3 individually obstructed ductal segments. 2. Successful placement of a right-sided 10 French internal/external biliary drainage catheter via the segment 6 posterior ductal system. 3. Successful exchange and repositioning of existing left-sided 12 French internal/external biliary drainage catheter. Electronically Signed   By: Jacqulynn Cadet M.D.   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 She presents for exchange and repositioning of existing left biliary drain as well as attempted placement of a right-sided biliary drainage catheter for persistent hyperbilirubinemia. EXAM: 1. Percutaneous transhepatic cholangiogram through existing access 2. Successful placement of a new 65 French right-sided internal/external biliary drainage catheter. 3. Exchange of existing left 12 French internal/external biliary drain. MEDICATIONS: 2 g Mefoxin; The antibiotic was administered within an appropriate time frame prior to the initiation of the procedure. ANESTHESIA/SEDATION: Moderate (conscious) sedation was employed during this procedure. A total of Versed 1.5 mg and Fentanyl 50 mcg was administered intravenously by the radiology nurse. Total intra-service moderate Sedation Time: 54 minutes. The patient's level of consciousness and vital signs were monitored continuously by radiology nursing throughout the procedure under my direct supervision. FLUOROSCOPY: Radiation Exposure Index (as provided by the fluoroscopic device): 456 mGy Kerma COMPLICATIONS: None immediate. PROCEDURE: Informed written consent was obtained from the patient after a thorough discussion of the procedural risks, benefits and alternatives. All questions were addressed. Maximal Sterile Barrier Technique was utilized including  caps, mask, sterile gowns, sterile gloves, sterile drape, hand hygiene and skin antiseptic. A timeout was performed prior to the initiation of the procedure. An initial contrast injection was performed through the existing percutaneous biliary drainage catheter. This is a left approach biliary drainage catheter. The tip is pulled back slightly but remains just within the small bowel. Unfortunately, no significant contrast material opacified the ductal system. There is preferential flow into the small bowel. Therefore, the tube was transected and removed over a wire. An 8 French sheath was  advanced over the wire and into the left intrahepatic ducts. Additional cholangiography was performed. Only the left-sided ducts opacify. There is complete occlusion of the ductal system in the region of the intrahepatic confluence. Maintaining wire access through the obstruction and into the duodenum, an angled catheter was advanced coaxially through the drain. With the assistance of a Glidewire, the right-sided ductal system was successfully catheterized. Additional cholangiography was then performed through the catheter. There is a stellate obstructing mass at the intrahepatic biliary ductal confluence. Unfortunately, numerous ducts exist as isolated and obstructed segments. The anterior and posterior ductal systems are completely separate and do not communicate. Additionally, the posterior ductal segments are also divided into at least 2 separate segments. It appears that the segment 6 ducts are most approachable for percutaneous access and may provide the most significant drainage. Therefore, a suitable skin entry site was selected and marked. Local anesthesia was attained by infiltration with 1% lidocaine. A 22 gauge Chiba needle was then carefully advanced under fluoroscopic guidance and used to puncture a tertiary radical of the right posterior ductal system. A 0.018 Nitrex wire was advanced into the hepatic hilum. The Accustick sheath was carefully advanced over the wire. A Glidewire was then successfully navigated through the obstructed portion of the right hepatic duct and into the small bowel. The glidewire was then exchanged for a superstiff Amplatz wire. The percutaneous tract was dilated to 10 [place]. A Cook 26 [place] biliary drainage catheter was then advanced over the wire and positioned such that the sideholes extended from the entry into the right posterior ducts through the obstructed segment and into the small bowel. Next, attention was returned to the existing left-sided duct. A new 12 [place]  cook biliary drainage catheter was advanced over the wire and formed with the pigtail loop in the small bowel. Both drains were secured to the skin with 0 Prolene suture and connected to gravity bag drainage. Images were obtained and stored for the medical record. IMPRESSION: 1. Stellate mass resulting in multifocal obstruction at the confluence of the intrahepatic ducts. The left and right ductal systems are completely separate, and unfortunately, the right system is separated into at least 3 individually obstructed ductal segments. 2. Successful placement of a right-sided 10 French internal/external biliary drainage catheter via the segment 6 posterior ductal system. 3. Successful exchange and repositioning of existing left-sided 12 French internal/external biliary drainage catheter. Electronically Signed   By: Jacqulynn Cadet M.D.   On: 07/02/2022 18:24    Labs:  CBC: Recent Labs    06/15/22 1242 06/26/22 0911 07/02/22 1124 07/23/22 0915  WBC 8.5 6.2 7.8 9.7  HGB 10.7* 11.0* 10.9* 11.9*  HCT 33.4* 33.5* 33.2* 36.6  PLT 358 301 266 366    COAGS: Recent Labs    06/02/22 1218 06/15/22 1242 07/02/22 1124  INR 1.3* 1.2 1.3*    BMP: Recent Labs    06/26/22 0911 07/02/22 1124 07/09/22 0849 07/23/22 0915  NA 138 138  137 137  K 3.5 3.1* 3.5 3.4*  CL 99 100 97* 100  CO2 '27 30 29 27  '$ GLUCOSE 187* 123* 186* 151*  BUN 12 7* 13 16  CALCIUM 9.7 8.9 9.6 9.2  CREATININE 1.04* 1.05* 0.99 0.94  GFRNONAA 55* 54* 58* >60    LIVER FUNCTION TESTS: Recent Labs    06/26/22 0911 07/02/22 1124 07/09/22 0849 07/23/22 0915  BILITOT 6.9* 10.0* 7.8* 6.5*  AST 57* 119* 52* 39  ALT 34 64* 33 32  ALKPHOS 420* 508* 506* 387*  PROT 7.3 7.0 7.1 7.3  ALBUMIN 3.5 2.5* 3.3* 2.9*    Assessment and Plan:  Cholangiocarcinoma Obstruction of hepatic ducts s/p percutaneous left biliary drain placement on 06/02/22 and right biliary drain on 7/27 Persistent hyperbilirubinemia. For cholangiogram  with drain exchange and possible placement of additional biliary drain. Labs reviewed. Plan for admission for observation after procedure   Risks and benefits of Biliary drain placement discussed with the patient including, but not limited to bleeding, infection which may lead to sepsis or even death and damage to adjacent structures.  This interventional procedure involves the use of X-rays and because of the nature of the planned procedure, it is possible that we will have prolonged use of X-ray fluoroscopy.  Potential radiation risks to you include (but are not limited to) the following: - A slightly elevated risk for cancer several years later in life. This risk is typically less than 0.5% percent. This risk is low in comparison to the normal incidence of human cancer, which is 33% for women and 50% for men according to the [place]. - Radiation induced injury can include skin redness, resembling a rash, tissue breakdown / ulcers and hair loss (which can be temporary or permanent).   The likelihood of either of these occurring depends on the difficulty of the procedure and whether you are sensitive to radiation due to previous procedures, disease, or genetic conditions.   IF your procedure requires a prolonged use of radiation, you will be notified and given written instructions for further action.  It is your responsibility to monitor the irradiated area for the 2 weeks following the procedure and to notify your physician if you are concerned that you have suffered a radiation induced injury.    All of the patient's questions were answered, patient is agreeable to proceed.  Consent signed and in chart.   Thank you for this interesting consult.  I greatly enjoyed meeting Barbara Byrd and look forward to participating in their care.  A copy of this report was sent to the requesting provider on this date.  Electronically Signed: Ascencion Dike, PA-C 07/28/2022, 8:19  AM   I spent a total of  20 minutes  in clinical consultation, greater than 50% of which was counseling/coordinating care for biliary drain placement

## 2022-07-29 ENCOUNTER — Encounter (HOSPITAL_COMMUNITY): Payer: Self-pay

## 2022-07-29 LAB — COMPREHENSIVE METABOLIC PANEL
ALT: 37 U/L (ref 0–44)
AST: 50 U/L — ABNORMAL HIGH (ref 15–41)
Albumin: 1.8 g/dL — ABNORMAL LOW (ref 3.5–5.0)
Alkaline Phosphatase: 344 U/L — ABNORMAL HIGH (ref 38–126)
Anion gap: 7 (ref 5–15)
BUN: 8 mg/dL (ref 8–23)
CO2: 24 mmol/L (ref 22–32)
Calcium: 8.6 mg/dL — ABNORMAL LOW (ref 8.9–10.3)
Chloride: 105 mmol/L (ref 98–111)
Creatinine, Ser: 0.74 mg/dL (ref 0.44–1.00)
GFR, Estimated: >60 mL/min (ref >60)
Glucose, Bld: 121 mg/dL — ABNORMAL HIGH (ref 70–99)
Potassium: 3.4 mmol/L — ABNORMAL LOW (ref 3.5–5.1)
Sodium: 136 mmol/L (ref 135–145)
Total Bilirubin: 4.8 mg/dL — ABNORMAL HIGH (ref 0.3–1.2)
Total Protein: 6.2 g/dL — ABNORMAL LOW (ref 6.5–8.1)

## 2022-07-29 NOTE — Plan of Care (Signed)
  Problem: Pain Managment: Goal: General experience of comfort will improve Outcome: Progressing   

## 2022-07-29 NOTE — Care Management Obs Status (Signed)
[facility] NOTIFICATION   Patient Details  Name: Barbara Byrd MRN: [id number] Date of Birth: 06/16/1941   Medicare Observation Status Notification Given:  Yes    Tom-Johnson, Renea Ee, RN 07/29/2022, 1:32 PM

## 2022-07-29 NOTE — Progress Notes (Signed)
Patient was discharged to home, AVS reviewed, IV removed, tele-box returned. Patient requested supplies to flush her drains and for dressing changes. Case manager followed up with her to ensure she will be able to get her supplies once she discharges. Supplies provided. Volunteers called to assist patient to the exit. Her sister provided transportation

## 2022-07-29 NOTE — TOC Transition Note (Signed)
Transition of Care [facility]) - CM/SW Discharge Note   Patient Details  Name: Barbara Byrd MRN: [id number] Date of Birth: [birth year]  [facility] CM/SW Contact:  Tom-Johnson, Renea Ee, RN Phone Number: 07/29/2022, 1:22 PM   Clinical Narrative:     Patient is scheduled for discharge today. Patient had Cholangiogram of left and right drain access, placement of new right Biliary int/ext drain and exchange of prior left and right int/ext biliary drain. Has three drains at this time. Patient request dressing supplies until her outpatient f/u. RN gave patient some supplies, CM spoke with patient's sister [name] and she states she usually gets patient's supplies from her MD outpatient and she will reach out to them when needed.  [name] to transport at discharge. No further TOC needs noted.     Barriers to Discharge: Barriers Resolved   Patient Goals and CMS Choice Patient states their goals for this hospitalization and ongoing recovery are:: Ton return home   Choice offered to / list presented to : NA  Discharge Placement                Patient to be transferred to facility by: Sister      Discharge Plan and Services                DME Arranged: N/A DME Agency: NA       HH Arranged: NA HH Agency: NA        Social Determinants of Health (SDOH) Interventions     Readmission Risk Interventions     No data to display

## 2022-07-29 NOTE — Discharge Summary (Addendum)
Patient ID: Barbara Byrd MRN: [id number] DOB/AGE: [birth year] 80 y.o.  Admit date: 07/28/2022 Discharge date: 07/29/2022  Supervising Physician: Corrie Mckusick  Patient Status: [facility] - In-pt  Admission Diagnoses: Cholangiocarcinoma/Hyperbilirubinemia  Discharge Diagnoses:  Principal Problem:   Hyperbilirubinemia   Discharged Condition: stable  Hospital Course: admitted for 24 hour observation post IR procedure 07/28/22 Pt has done well overnight. Denies N/V. Denies new pain or complaints. Has eaten well; has urinated and passing gas. Has been up in room and walking with help. Total Bili has decreased to 4.8 today. Potassium borderline low--- pt taking supplement at home--- was not Rx in house Whe will take home meds later today  I have seen pt and Dr Earleen Newport is aware of pts condition and status Plan for DC this am   Consults: None  Significant Diagnostic Studies:  Results for orders placed or performed during the hospital encounter of 07/28/22  CBC with Differential/Platelet  Result Value Ref Range   WBC 7.6 4.0 - 10.5 K/uL   RBC 4.71 3.87 - 5.11 MIL/uL   Hemoglobin 13.2 12.0 - 15.0 g/dL   HCT 41.2 36.0 - 46.0 %   MCV 87.5 80.0 - 100.0 fL   MCH 28.0 26.0 - 34.0 pg   MCHC 32.0 30.0 - 36.0 g/dL   RDW 15.7 (H) 11.5 - 15.5 %   Platelets 257 150 - 400 K/uL   nRBC 0.0 0.0 - 0.2 %   Neutrophils Relative % 72 %   Neutro Abs 5.4 1.7 - 7.7 K/uL   Lymphocytes Relative 17 %   Lymphs Abs 1.3 0.7 - 4.0 K/uL   Monocytes Relative 10 %   Monocytes Absolute 0.8 0.1 - 1.0 K/uL   Eosinophils Relative 0 %   Eosinophils Absolute 0.0 0.0 - 0.5 K/uL   Basophils Relative 0 %   Basophils Absolute 0.0 0.0 - 0.1 K/uL   Immature Granulocytes 1 %   Abs Immature Granulocytes 0.07 0.00 - 0.07 K/uL  Comprehensive metabolic panel  Result Value Ref Range   Sodium 136 135 - 145 mmol/L   Potassium 4.0 3.5 - 5.1 mmol/L   Chloride 101 98 - 111 mmol/L   CO2 24 22 - 32 mmol/L   Glucose, Bld  130 (H) 70 - 99 mg/dL   BUN 14 8 - 23 mg/dL   Creatinine, Ser 1.11 (H) 0.44 - 1.00 mg/dL   Calcium 8.8 (L) 8.9 - 10.3 mg/dL   Total Protein 7.2 6.5 - 8.1 g/dL   Albumin 2.1 (L) 3.5 - 5.0 g/dL   AST 59 (H) 15 - 41 U/L   ALT 40 0 - 44 U/L   Alkaline Phosphatase 450 (H) 38 - 126 U/L   Total Bilirubin 5.3 (H) 0.3 - 1.2 mg/dL   GFR, Estimated 51 (L) >60 mL/min   Anion gap 11 5 - 15  Protime-INR  Result Value Ref Range   Prothrombin Time 16.1 (H) 11.4 - 15.2 seconds   INR 1.3 (H) 0.8 - 1.2  Comprehensive metabolic panel  Result Value Ref Range   Sodium 136 135 - 145 mmol/L   Potassium 3.4 (L) 3.5 - 5.1 mmol/L   Chloride 105 98 - 111 mmol/L   CO2 24 22 - 32 mmol/L   Glucose, Bld 121 (H) 70 - 99 mg/dL   BUN 8 8 - 23 mg/dL   Creatinine, Ser 0.74 0.44 - 1.00 mg/dL   Calcium 8.6 (L) 8.9 - 10.3 mg/dL   Total Protein 6.2 (L) 6.5 - 8.1  g/dL   Albumin 1.8 (L) 3.5 - 5.0 g/dL   AST 50 (H) 15 - 41 U/L   ALT 37 0 - 44 U/L   Alkaline Phosphatase 344 (H) 38 - 126 U/L   Total Bilirubin 4.8 (H) 0.3 - 1.2 mg/dL   GFR, Estimated >60 >60 mL/min   Anion gap 7 5 - 15     Treatments: Procedure 8/22 in IR:   Cholangiogram of left and right drain access.  US guided placement of new right biliary int/ext drain.  Exchange of prior left and right int/ext biliary drain.    Findings: The cholangiogram from the prior L & R drains show no communication to the undrained ducts.    New drain into the duodenum.    Complications: None  Discharge Exam: Blood pressure (!) 111/49, pulse 75, temperature 98 F (36.7 C), temperature source Oral, resp. rate 18, height '5\' 4"'$  (1.626 m), weight 222 lb 3.6 oz (100.8 kg), SpO2 93 %.  A/O [place]: RRR Lungs: CTA Abd soft; NT 3 Bili drains intact; Clean and dry OP all 3 bile Flush easily FROM:  walked to restroom with assistance Urine OP great  Disposition:   For DC now Resume all meds Follow with Dr Benay Spice as scheduled Pt will hear from IR for  2 week follow up of drains She is to flush drains daily or every other day with 5 cc sterile saline (she has flushes at home) Pt has good understanding of DC planning and follow up schedule   Discharge Instructions     Call MD for:  difficulty breathing, headache or visual disturbances   Complete by: As directed    Call MD for:  extreme fatigue   Complete by: As directed    Call MD for:  hives   Complete by: As directed    Call MD for:  persistant dizziness or light-headedness   Complete by: As directed    Call MD for:  persistant nausea and vomiting   Complete by: As directed    Call MD for:  redness, tenderness, or signs of infection (pain, swelling, redness, odor or green/yellow discharge around incision site)   Complete by: As directed    Call MD for:  severe uncontrolled pain   Complete by: As directed    Call MD for:  temperature >100.4   Complete by: As directed    Diet - low sodium heart healthy   Complete by: As directed    Discharge instructions   Complete by: As directed    Resume all home meds; follow up with Dr Benay Spice as directed; pt will hear from IR for 2 week follow up regarding drains; call [phone] with any questions or concerns   Discharge wound care:   Complete by: As directed    Keep biliary drains dressed and dry; do not submerge; flush drains at home daily or every other day 5 cc sterile saline   Increase activity slowly   Complete by: As directed    Lifting restrictions   Complete by: As directed    No lifting over 10 lbs x 2 weeks      Allergies as of 07/29/2022   No Known Allergies      Medication List     TAKE these medications    amLODipine 10 MG tablet Commonly known as: NORVASC Take 1 tablet by mouth daily.   bisoprolol-hydrochlorothiazide 5-6.25 MG tablet Commonly known as: ZIAC Take 1 tablet by mouth daily.   potassium  chloride 10 MEQ tablet Commonly known as: KLOR-CON Take 1 tablet (10 mEq total) by mouth daily.    rosuvastatin 5 MG tablet Commonly known as: CRESTOR Take 5 mg by mouth daily.   traMADol 50 MG tablet Commonly known as: ULTRAM Take 1 tablet (50 mg total) by mouth every 6 (six) hours as needed.               Discharge Care Instructions  (From admission, onward)           Start     Ordered   07/29/22 0000  Discharge wound care:       Comments: Keep biliary drains dressed and dry; do not submerge; flush drains at home daily or every other day 5 cc sterile saline   07/29/22 4696              Electronically Signed: Lavonia Drafts, PA-C 07/29/2022, 9:44 AM   I have spent Greater Than 30 Minutes discharging Barbara Byrd.

## 2022-08-06 LAB — GUARDANT 360

## 2022-08-07 ENCOUNTER — Encounter: Payer: Self-pay | Admitting: Nurse Practitioner

## 2022-08-07 ENCOUNTER — Inpatient Hospital Stay: Payer: Medicare HMO | Admitting: Nurse Practitioner

## 2022-08-07 ENCOUNTER — Inpatient Hospital Stay: Payer: Medicare HMO | Attending: Oncology

## 2022-08-07 ENCOUNTER — Telehealth: Payer: Self-pay

## 2022-08-07 VITALS — BP 114/67 | HR 72 | Temp 98.2°F | Resp 18 | Ht 64.0 in | Wt 219.0 lb

## 2022-08-07 DIAGNOSIS — C240 Malignant neoplasm of extrahepatic bile duct: Secondary | ICD-10-CM | POA: Insufficient documentation

## 2022-08-07 DIAGNOSIS — Z9049 Acquired absence of other specified parts of digestive tract: Secondary | ICD-10-CM | POA: Diagnosis not present

## 2022-08-07 DIAGNOSIS — C221 Intrahepatic bile duct carcinoma: Secondary | ICD-10-CM

## 2022-08-07 DIAGNOSIS — E785 Hyperlipidemia, unspecified: Secondary | ICD-10-CM | POA: Diagnosis not present

## 2022-08-07 DIAGNOSIS — C7800 Secondary malignant neoplasm of unspecified lung: Secondary | ICD-10-CM | POA: Diagnosis present

## 2022-08-07 DIAGNOSIS — I10 Essential (primary) hypertension: Secondary | ICD-10-CM | POA: Diagnosis not present

## 2022-08-07 LAB — CMP (CANCER CENTER ONLY)
ALT: 25 U/L (ref 0–44)
AST: 42 U/L — ABNORMAL HIGH (ref 15–41)
Albumin: 3 g/dL — ABNORMAL LOW (ref 3.5–5.0)
Alkaline Phosphatase: 460 U/L — ABNORMAL HIGH (ref 38–126)
Anion gap: 11 (ref 5–15)
BUN: 13 mg/dL (ref 8–23)
CO2: 26 mmol/L (ref 22–32)
Calcium: 9.2 mg/dL (ref 8.9–10.3)
Chloride: 98 mmol/L (ref 98–111)
Creatinine: 0.88 mg/dL (ref 0.44–1.00)
GFR, Estimated: >60 mL/min (ref >60)
Glucose, Bld: 126 mg/dL — ABNORMAL HIGH (ref 70–99)
Potassium: 3.8 mmol/L (ref 3.5–5.1)
Sodium: 135 mmol/L (ref 135–145)
Total Bilirubin: 4.2 mg/dL (ref 0.3–1.2)
Total Protein: 7.8 g/dL (ref 6.5–8.1)

## 2022-08-07 MED ORDER — POTASSIUM CHLORIDE ER 10 MEQ PO TBCR: 10.0000 meq | EXTENDED_RELEASE_TABLET | Freq: Every day | ORAL | 2 refills | Status: DC

## 2022-08-07 NOTE — Progress Notes (Addendum)
[place] OFFICE PROGRESS NOTE   Diagnosis: Cholangiocarcinoma  INTERVAL HISTORY:   Barbara Byrd returns as scheduled.  She had a new right biliary drain placed on 07/28/2022.  A left and right biliary drain was exchanged.  Overall she is feeling better.  Appetite has improved.  No nausea or vomiting.  No diarrhea.  No fever or chills.  She needs supplies to flush the biliary drains and also perform dressing changes.  Objective:  Vital signs in last 24 hours:  Blood pressure 114/67, pulse 72, temperature 98.2 F (36.8 C), temperature source Oral, resp. rate 18, height '5\' 4"'$  (1.626 m), weight 219 lb (99.3 kg), SpO2 100 %.    HEENT: No thrush or ulcers. Resp: Lungs clear bilaterally. Cardio: Regular rate and rhythm. GI: Abdomen soft and nontender.  No hepatomegaly.  Right and left biliary drains covered with gauze dressings. Vascular: No leg edema. Neuro: Alert and oriented.    Lab Results:  Lab Results  Component Value Date   WBC 7.6 07/28/2022   HGB 13.2 07/28/2022   HCT 41.2 07/28/2022   MCV 87.5 07/28/2022   PLT 257 07/28/2022   NEUTROABS 5.4 07/28/2022    Imaging:  No results found.  Medications: I have reviewed the patient's current medications.  Assessment/Plan: Common bile duct cholangiocarcinoma-stage IIb,pT2pN1 Presenting with obstructive jaundice summer 2019 Elevated preoperative CA 19-9 ERCP/EUS 09/02/2018-stenosis and lower third of the main bile duct, mass in the uncinate of the pancreas,-biopsy revealed minute focus of atypical cells Pancreaticoduodenectomy 11/15/2018,pT2pN1 moderately differentiated adenocarcinoma of the common bile duct, 2/35 lymph nodes, perineural invasion Patient reports 6 months of adjuvant gemcitabine/capecitabine Seen with complaint of abdominal pain 02/23/2022, CA 19-9 elevated, referred for CTs CTs 02/27/2022 status post Whipple without specific evidence of recurrent or metastatic disease.  Tiny pulmonary nodules  not readily apparent on the outside CT of 07/27/2019.  No thoracic adenopathy. Enlarged heterogeneous left lobe of the thyroid grossly similar to the prior exam. PET 05/27/3085-VHQIONGEXB hypermetabolic liver lesions consistent with metastases, mild uptake involving a right common iliac lymph node MRI abdomen 05/17/2022-lesions in the right liver noted on PET are not well defined on MRI, left and right intrahepatic biliary dilatation, suggesting obstruction at the confluence of the left and right hepatic ducts, hypoenhancing lesion at this level measuring 10 x 12 mm.  Central hypermetabolic activity in this region on the PET, no evidence of nodal disease 06/02/2022 cholangiogram and placement of a biliary drain by interventional radiology-central intrahepatic biliary dilatation but minimal peripheral biliary dilatation; right hepatic lobe demonstrated irregular dilated central bile ducts with drainage through the surgical anastomosis into the small bowel; narrowing at the surgical anastomosis; dilated left hepatic bile duct demonstrated filling defects within the bile duct; contrast eventually drained into the small bowel through the hepaticojejunostomy.  Cytology on the biliary tract, bile, biliary drain suspicious for malignancy. 06/15/2022-cholangiogram confirmed central left biliary stenosis, biliary brush biopsy-adenocarcinoma, left biliary drain exchanged and upsized  CT abdomen/pelvis 06/27/2022-numerous small pulmonary nodules in the included bilateral lung bases which are new and enlarged compared to exam dated 02/27/2022.  Interval placement of left-sided percutaneous biliary drain.  Similar intrahepatic biliary ductal dilatation when compared to prior MRI most notable left lobe of the liver. 07/02/2022 biliary drain exchanged on the left and new drain placed on the right 07/28/2022 new right biliary internal/external drain placed; exchange of prior left and right internal/external biliary drain. 2.   Hypertension 3.  Hyperlipidemia 4.  COVID-19 2021 5.  06/02/2022 admitted  with rigors, bradycardia, hypotension, nausea/vomiting following placement of the biliary drain, likely acute cholangitis, blood culture positive for E. coli, discharged home on levofloxacin      Disposition: Barbara Byrd appears stable.  She underwent placement of a new right biliary drain and exchange of prior left and right biliary drains on 07/28/2022.  Bilirubin is better and will hopefully continue to trend downward.  She will return for a repeat chemistry panel on 08/18/2022.  We will see her in follow-up on 08/31/2022.  We are contacting the drain clinic to assist her with getting supplies.    Ned Card ANP/GNP-BC   08/07/2022  9:25 AM

## 2022-08-07 NOTE — Telephone Encounter (Signed)
CRITICAL VALUE STICKER  CRITICAL VALUE: T Bili 4.2  RECEIVER (on-site recipient of call): Tresea Mall, RN  [place] NOTIFIED: 91/2023 [phone]  MESSENGER (representative from lab): Simona Huh  MD NOTIFIED: Ned Card NP, [phone]  TIME OF NOTIFICATION: 0945  RESPONSE: Labs improved, here for office visit

## 2022-08-11 ENCOUNTER — Other Ambulatory Visit (HOSPITAL_COMMUNITY): Payer: Self-pay

## 2022-08-11 MED ORDER — NORMAL SALINE FLUSH 0.9 % IV SOLN
INTRAVENOUS | 2 refills | Status: DC
  Filled 2022-08-11: qty 900, 30d supply, fill #0

## 2022-08-12 ENCOUNTER — Other Ambulatory Visit (HOSPITAL_COMMUNITY): Payer: Self-pay

## 2022-08-18 ENCOUNTER — Inpatient Hospital Stay: Payer: Medicare HMO

## 2022-08-18 ENCOUNTER — Telehealth: Payer: Self-pay

## 2022-08-18 DIAGNOSIS — C221 Intrahepatic bile duct carcinoma: Secondary | ICD-10-CM

## 2022-08-18 DIAGNOSIS — C240 Malignant neoplasm of extrahepatic bile duct: Secondary | ICD-10-CM | POA: Diagnosis not present

## 2022-08-18 LAB — CMP (CANCER CENTER ONLY)
ALT: 24 U/L (ref 0–44)
AST: 31 U/L (ref 15–41)
Albumin: 3 g/dL — ABNORMAL LOW (ref 3.5–5.0)
Alkaline Phosphatase: 444 U/L — ABNORMAL HIGH (ref 38–126)
Anion gap: 12 (ref 5–15)
BUN: 30 mg/dL — ABNORMAL HIGH (ref 8–23)
CO2: 24 mmol/L (ref 22–32)
Calcium: 9.5 mg/dL (ref 8.9–10.3)
Chloride: 102 mmol/L (ref 98–111)
Creatinine: 1.49 mg/dL — ABNORMAL HIGH (ref 0.44–1.00)
GFR, Estimated: 36 mL/min — ABNORMAL LOW (ref >60)
Glucose, Bld: 168 mg/dL — ABNORMAL HIGH (ref 70–99)
Potassium: 4.2 mmol/L (ref 3.5–5.1)
Sodium: 138 mmol/L (ref 135–145)
Total Bilirubin: 3.1 mg/dL — ABNORMAL HIGH (ref 0.3–1.2)
Total Protein: 7.8 g/dL (ref 6.5–8.1)

## 2022-08-18 NOTE — Telephone Encounter (Signed)
Patient gave verbal understanding. She stated she drinking enough of water, she will try to increase the amount of fluids. Denied any nausea or diarrhea

## 2022-08-18 NOTE — Telephone Encounter (Signed)
-----   Message from Owens Shark, NP sent at 08/18/2022  2:43 PM EDT ----- Please let her know bilirubin is better.  Creatinine is mildly elevated.  Is she drinking enough fluids?  Any nausea or diarrhea?

## 2022-08-19 ENCOUNTER — Other Ambulatory Visit: Payer: Self-pay

## 2022-08-19 ENCOUNTER — Telehealth: Payer: Self-pay

## 2022-08-19 DIAGNOSIS — C221 Intrahepatic bile duct carcinoma: Secondary | ICD-10-CM

## 2022-08-19 NOTE — Telephone Encounter (Signed)
Patient gave verbal understanding and had no further questions or concerns  

## 2022-08-19 NOTE — Telephone Encounter (Signed)
-----   Message from Owens Shark, NP sent at 08/19/2022  7:27 AM EDT ----- Was anyone able to reach her yesterday? She is on a diuretic--have her hold Ziac, increase fluid intake, repeat CMET on 9/19

## 2022-08-25 ENCOUNTER — Inpatient Hospital Stay: Payer: Medicare HMO

## 2022-08-25 DIAGNOSIS — C240 Malignant neoplasm of extrahepatic bile duct: Secondary | ICD-10-CM | POA: Diagnosis not present

## 2022-08-25 DIAGNOSIS — C221 Intrahepatic bile duct carcinoma: Secondary | ICD-10-CM

## 2022-08-25 LAB — COMPREHENSIVE METABOLIC PANEL
ALT: 25 U/L (ref 0–44)
AST: 34 U/L (ref 15–41)
Albumin: 2.8 g/dL — ABNORMAL LOW (ref 3.5–5.0)
Alkaline Phosphatase: 504 U/L — ABNORMAL HIGH (ref 38–126)
Anion gap: 12 (ref 5–15)
BUN: 21 mg/dL (ref 8–23)
CO2: 24 mmol/L (ref 22–32)
Calcium: 8.8 mg/dL — ABNORMAL LOW (ref 8.9–10.3)
Chloride: 97 mmol/L — ABNORMAL LOW (ref 98–111)
Creatinine, Ser: 0.97 mg/dL (ref 0.44–1.00)
GFR, Estimated: 59 mL/min — ABNORMAL LOW (ref >60)
Glucose, Bld: 194 mg/dL — ABNORMAL HIGH (ref 70–99)
Potassium: 4.4 mmol/L (ref 3.5–5.1)
Sodium: 133 mmol/L — ABNORMAL LOW (ref 135–145)
Total Bilirubin: 3.1 mg/dL — ABNORMAL HIGH (ref 0.3–1.2)
Total Protein: 7 g/dL (ref 6.5–8.1)

## 2022-08-26 ENCOUNTER — Other Ambulatory Visit (HOSPITAL_COMMUNITY): Payer: Self-pay | Admitting: Interventional Radiology

## 2022-08-26 ENCOUNTER — Telehealth: Payer: Self-pay

## 2022-08-26 ENCOUNTER — Other Ambulatory Visit: Payer: Self-pay | Admitting: Radiology

## 2022-08-26 ENCOUNTER — Ambulatory Visit
Admission: RE | Admit: 2022-08-26 | Discharge: 2022-08-26 | Disposition: A | Discharge status: Home / Self Care | Payer: Medicare HMO | Source: Ambulatory Visit | Attending: Radiology | Admitting: Radiology

## 2022-08-26 HISTORY — PX: IR RADIOLOGIST EVAL & MGMT: IMG5224

## 2022-08-26 NOTE — Telephone Encounter (Signed)
Patient gave verbal understanding and had no further questions or concerns  

## 2022-08-26 NOTE — Telephone Encounter (Signed)
-----   Message from Owens Shark, NP sent at 08/26/2022  7:47 AM EDT ----- Please let her know kidney function is better

## 2022-08-26 NOTE — Progress Notes (Signed)
Referring Physician(s): Dr Edrick Kins  Chief Complaint: The patient is seen in follow up today s/p IR procedure 07/28/22:  Status post image guided placement of a new right-sided internal/external biliary drain through the undrained right-sided bile ducts, as well as exchange of both left and right-sided internal/external biliary drains.  History of present illness:  Obstructing cholangiocarcinoma and hyperbilirubinemia, with persistently elevated total bilirubin. Follows with Dr Benay Spice Was last seen in Oncology 9/1--- was doing well per notes Denied N/V/D No fever/chills New appt with Onc 9/25 Yesterday labs:  TB 3.1   Scheduled in IR today for drain check (x3) Doing well Continues to deny N/V/D Denies chills/fever Occasional pain in low abd Right drains cause her some pain also--- but not much Flushes each drain daily OP is brown; thin fluid     Past Medical History:  Diagnosis Date   Family history of cancer of pituitary gland and craniopharyngeal duct    Family history of prostate cancer     Past Surgical History:  Procedure Laterality Date   IR BILIARY DRAIN PLACEMENT WITH CHOLANGIOGRAM  07/02/2022   IR CHOLANGIOGRAM EXISTING TUBE  06/15/2022   IR ENDOLUMINAL BX OF BILIARY TREE  06/15/2022   IR EXCHANGE BILIARY DRAIN  07/02/2022   IR EXCHANGE BILIARY DRAIN  07/28/2022   IR EXCHANGE BILIARY DRAIN  07/28/2022   IR INT EXT BILIARY DRAIN WITH CHOLANGIOGRAM  06/02/2022   IR INT EXT BILIARY DRAIN WITH CHOLANGIOGRAM  07/28/2022   IR REMOVAL TUN ACCESS W/ PORT W/O FL MOD SED  11/14/2021    Allergies: Patient has no known allergies.  Medications: Prior to Admission medications   Medication Sig Start Date End Date Taking? Authorizing Provider  amLODipine (NORVASC) 10 MG tablet Take 1 tablet by mouth daily.    [provider]  bisoprolol-hydrochlorothiazide (ZIAC) 5-6.25 MG tablet Take 1 tablet by mouth daily. 04/15/21   [provider]  potassium  chloride (KLOR-CON) 10 MEQ tablet Take 1 tablet (10 mEq total) by mouth daily. 08/07/22   Owens Shark, NP  rosuvastatin (CRESTOR) 5 MG tablet Take 5 mg by mouth daily. 04/15/21   [provider]  Sodium Chloride Flush (NORMAL SALINE FLUSH) 0.9 % SOLN Use to flush biliary tube 3 times a day 08/11/22   Ladell Pier, MD  traMADol (ULTRAM) 50 MG tablet Take 1 tablet (50 mg total) by mouth every 6 (six) hours as needed. 07/23/22   Ladell Pier, MD     Family History  Problem Relation Age of Onset   Heart disease Mother 1   Cerebral aneurysm Father 53   Brain cancer Brother 19       pituitary tumor   Stroke Maternal Grandmother    Brain cancer Nephew 38       pituitary gland   Prostate cancer Nephew 72    Social History   Socioeconomic History   Marital status: Single    Spouse name: Not on file   Number of children: Not on file   Years of education: Not on file   Highest education level: Not on file  Occupational History   Not on file  Tobacco Use   Smoking status: Never   Smokeless tobacco: Not on file  Vaping Use   Vaping Use: Never used  Substance and Sexual Activity   Alcohol use: Yes    Comment: socially   Drug use: Never   Sexual activity: Not on file  Other Topics Concern   Not  on file  Social History Narrative   Not on file   Social Determinants of Health   Financial Resource Strain: Not on file  Food Insecurity: Not on file  Transportation Needs: Not on file  Physical Activity: Not on file  Stress: Not on file  Social Connections: Not on file     Vital Signs: BP 104/60 (BP Location: Right Arm)   Pulse 71   Temp (!) 96.1 F (35.6 C) (Oral)   SpO2 97%   Physical Exam Skin:    General: Skin is warm.     Comments: Sites are clean and dry NT to touch All are without sign of infection  Drain injections performed by Dr Serafina Royals Left drain: patent and in good position Right superior drain : patent and in good position Right  inferior/lateral: retracted somewhat; leaking at site      Imaging: No results found.  Labs:  CBC: Recent Labs    06/26/22 0911 07/02/22 1124 07/23/22 0915 07/28/22 0752  WBC 6.2 7.8 9.7 7.6  HGB 11.0* 10.9* 11.9* 13.2  HCT 33.5* 33.2* 36.6 41.2  PLT 301 266 366 257    COAGS: Recent Labs    06/02/22 1218 06/15/22 1242 07/02/22 1124 07/28/22 0900  INR 1.3* 1.2 1.3* 1.3*    BMP: Recent Labs    07/29/22 0544 08/07/22 0844 08/18/22 0906 08/25/22 1102  NA 136 135 138 133*  K 3.4* 3.8 4.2 4.4  CL 105 98 102 97*  CO2 '24 26 24 24  '$ GLUCOSE 121* 126* 168* 194*  BUN 8 13 30* 21  CALCIUM 8.6* 9.2 9.5 8.8*  CREATININE 0.74 0.88 1.49* 0.97  GFRNONAA >60 >60 36* 59*    LIVER FUNCTION TESTS: Recent Labs    07/29/22 0544 08/07/22 0844 08/18/22 0906 08/25/22 1102  BILITOT 4.8* 4.2* 3.1* 3.1*  AST 50* 42* 31 34  ALT 37 '25 24 25  '$ ALKPHOS 344* 460* 444* 504*  PROT 6.2* 7.8 7.8 7.0  ALBUMIN 1.8* 3.0* 3.0* 2.8*    Assessment:  Cholangiocarcinoma/hyperbilirubinemia T Bili 3.1 yesterday Left drain and Rt superior drain were capped per Dr Serafina Royals Rt inferior/lateral drain - new drain placed.  She is to have drain exchange at [facility] this week (hoping for Friday 9/22 with Dr Serafina Royals)--- we will call her with time and date of appt. Will need 2 week CMP Will need 6-8 week follow up at [facility] for appt with sedation: Drains to be evaluated and possible internal stents placed at that time. She and her sister have good understanding of this plan If pt develops pain; N/V; fever--- please call IR Clinic- will need to revisit and possibly place capped drains back to flow. Again, she has good understanding Keep appt with Oncology 08/31/22  Signed: Lavonia Drafts, PA-C 08/26/2022, 1:07 PM   Please refer to Dr. Serafina Royals attestation of this note for management and plan.

## 2022-08-27 ENCOUNTER — Ambulatory Visit (HOSPITAL_COMMUNITY): Admission: RE | Admit: 2022-08-27 | Payer: Medicare HMO | Source: Ambulatory Visit

## 2022-08-29 ENCOUNTER — Encounter (HOSPITAL_COMMUNITY): Payer: Self-pay | Admitting: Emergency Medicine

## 2022-08-29 ENCOUNTER — Other Ambulatory Visit: Payer: Self-pay

## 2022-08-29 ENCOUNTER — Emergency Department (HOSPITAL_COMMUNITY)
Admission: EM | Admit: 2022-08-29 | Discharge: 2022-08-29 | Disposition: A | Discharge status: Home / Self Care | Payer: Medicare HMO | Attending: Emergency Medicine | Admitting: Emergency Medicine

## 2022-08-29 DIAGNOSIS — Z8509 Personal history of malignant neoplasm of other digestive organs: Secondary | ICD-10-CM | POA: Diagnosis not present

## 2022-08-29 DIAGNOSIS — Z79899 Other long term (current) drug therapy: Secondary | ICD-10-CM | POA: Diagnosis not present

## 2022-08-29 DIAGNOSIS — T85590A Other mechanical complication of bile duct prosthesis, initial encounter: Secondary | ICD-10-CM | POA: Diagnosis present

## 2022-08-29 HISTORY — DX: Malignant neoplasm of pancreas, unspecified: C25.9

## 2022-08-29 LAB — COMPREHENSIVE METABOLIC PANEL
ALT: 25 U/L (ref 0–44)
AST: 33 U/L (ref 15–41)
Albumin: 2 g/dL — ABNORMAL LOW (ref 3.5–5.0)
Alkaline Phosphatase: 449 U/L — ABNORMAL HIGH (ref 38–126)
Anion gap: 10 (ref 5–15)
BUN: 17 mg/dL (ref 8–23)
CO2: 24 mmol/L (ref 22–32)
Calcium: 8.7 mg/dL — ABNORMAL LOW (ref 8.9–10.3)
Chloride: 100 mmol/L (ref 98–111)
Creatinine, Ser: 1.23 mg/dL — ABNORMAL HIGH (ref 0.44–1.00)
GFR, Estimated: 45 mL/min — ABNORMAL LOW (ref >60)
Glucose, Bld: 109 mg/dL — ABNORMAL HIGH (ref 70–99)
Potassium: 3.9 mmol/L (ref 3.5–5.1)
Sodium: 134 mmol/L — ABNORMAL LOW (ref 135–145)
Total Bilirubin: 3.2 mg/dL — ABNORMAL HIGH (ref 0.3–1.2)
Total Protein: 7.2 g/dL (ref 6.5–8.1)

## 2022-08-29 NOTE — ED Triage Notes (Signed)
Pt. Stated, I have 3 biliary drainage bags for hx of pancreatic cancer went away and it came back and trying to bring my bilirubin down with the drainage bags. I have 3 . One of them is not working and leaking.

## 2022-08-29 NOTE — Discharge Instructions (Addendum)
Drain the bag daily.  Continue to dress the drains the way you usually do.  If you develop fever, vomiting or other concerns return to the ER or call your doctor.

## 2022-08-29 NOTE — ED Provider Notes (Signed)
Barbara Byrd   CSN: [id number] Arrival date & time: 08/29/22  0930     History  Chief Complaint  Patient presents with   biliary bag no draining   Abdominal Pain    Barbara Byrd is a 81 y.o. female.  Pt is a 81y/o female with hx of Obstructing cholangiocarcinoma and hyperbilirubinemia, with persistently elevated total bilirubin. Follows with Dr Benay Spice with new right-sided internal/external biliary drain through the undrained right-sided bile ducts, as well as exchange of both left and right-sided internal/external biliary drains on Thursday with 2 of the 3 drains capped who is presenting today because she is leaking excessively around her drains.  It is saturating her bandages and getting all over her clothing.  It is starting to irritate her skin.  She is not sure when she is going to be having her next procedure in IR as they have not called her yet but because of the ongoing drainage she came for evaluation.  She is not having excessive abdominal pain, nausea vomiting or fever.    Abdominal Pain      Home Medications Prior to Admission medications   Medication Sig Start Date End Date Taking? Authorizing Provider  amLODipine (NORVASC) 10 MG tablet Take 1 tablet by mouth daily.    [provider]  bisoprolol-hydrochlorothiazide (ZIAC) 5-6.25 MG tablet Take 1 tablet by mouth daily. 04/15/21   [provider]  potassium chloride (KLOR-CON) 10 MEQ tablet Take 1 tablet (10 mEq total) by mouth daily. 08/07/22   Owens Shark, NP  rosuvastatin (CRESTOR) 5 MG tablet Take 5 mg by mouth daily. 04/15/21   [provider]  Sodium Chloride Flush (NORMAL SALINE FLUSH) 0.9 % SOLN Use to flush biliary tube 3 times a day 08/11/22   Ladell Pier, MD  traMADol (ULTRAM) 50 MG tablet Take 1 tablet (50 mg total) by mouth every 6 (six) hours as needed. 07/23/22   Ladell Pier, MD      Allergies    Patient has no  known allergies.    Review of Systems   Review of Systems  Gastrointestinal:  Positive for abdominal pain.    Physical Exam Updated Vital Signs BP 114/62   Pulse 68   Temp 98.1 F (36.7 C) (Oral)   Resp 18   Ht '5\' 4"'$  (1.626 m)   SpO2 97%   BMI 37.59 kg/m  Physical Exam Vitals and nursing Byrd reviewed.  Constitutional:      General: She is not in acute distress.    Appearance: She is well-developed.  HENT:     Head: Normocephalic and atraumatic.  Eyes:     General: Scleral icterus present.     Pupils: Pupils are equal, round, and reactive to light.  Cardiovascular:     Rate and Rhythm: Normal rate and regular rhythm.     Heart sounds: Normal heart sounds. No murmur heard.    No friction rub.  Pulmonary:     Effort: Pulmonary effort is normal.     Breath sounds: Normal breath sounds. No wheezing or rales.  Abdominal:     General: Bowel sounds are normal. There is distension.     Palpations: Abdomen is soft.     Tenderness: There is no abdominal tenderness. There is no guarding or rebound.     Comments: LUQ drain is capped and leaking biliary fluid over the bandages.  Skin is clean and intact.  Superior right upper drain intact  and capped.  It is also saturated her bandages with biliary fluid.  Posterior right drain intact and draining biliarly fluid that is thin.  Minimal skin irritation on the right.  Musculoskeletal:        General: No tenderness. Normal range of motion.     Comments: No edema  Skin:    General: Skin is warm and dry.     Findings: No rash.  Neurological:     Mental Status: She is alert and oriented to person, place, and time.     Cranial Nerves: No cranial nerve deficit.  Psychiatric:        Behavior: Behavior normal.     ED Results / Procedures / Treatments   Labs (all labs ordered are listed, but only abnormal results are displayed) Labs Reviewed  COMPREHENSIVE METABOLIC PANEL - Abnormal; Notable for the following components:      Result  Value   Sodium 134 (*)    Glucose, Bld 109 (*)    Creatinine, Ser 1.23 (*)    Calcium 8.7 (*)    Albumin 2.0 (*)    Alkaline Phosphatase 449 (*)    Total Bilirubin 3.2 (*)    GFR, Estimated 45 (*)    All other components within normal limits    EKG None  Radiology No results found.  Procedures Procedures    Medications Ordered in ED Medications - No data to display  ED Course/ Medical Decision Making/ A&P                           Medical Decision Making Amount and/or Complexity of Data Reviewed External Data Reviewed: notes.    Details: IR notes Labs: ordered. Decision-making details documented in ED Course.   Pt with multiple medical problems and comorbidities and presenting today with a complaint that caries a high risk for morbidity and mortality.  Here today due to excessive leaking of biliary fluid onto her bandages that is soaking through onto her clothing.  This all started after having her 2 of 3 biliary drains capped.  Her drain that is not capped is still been draining fluid.  Patient is not having fever does not have excessive new abdominal pain and is otherwise well-appearing.  Spoke with Dr. Laurence Ferrari with interventional radiology who reports that it is reasonable to look the drains back up and they will have to evaluate her this week which already appears to have been planned.  1:09 PM Drains re-attached and pt draining into bags.  I independently interpreted patient's labs and her CMP with stable bilirubin of 3.2 today unchanged from prior.  Attempting to get all the supplies to redress her drains and then patient is stable for discharge         Final Clinical Impression(s) / ED Diagnoses Final diagnoses:  Obstruction of percutaneous transhepatic biliary drainage catheter    Rx / DC Orders ED Discharge Orders     None         Blanchie Dessert, MD 08/29/22 1309

## 2022-08-31 ENCOUNTER — Inpatient Hospital Stay: Payer: Medicare HMO

## 2022-08-31 ENCOUNTER — Other Ambulatory Visit (HOSPITAL_COMMUNITY): Payer: Self-pay | Admitting: Interventional Radiology

## 2022-08-31 ENCOUNTER — Inpatient Hospital Stay: Payer: Medicare HMO | Admitting: Nurse Practitioner

## 2022-08-31 ENCOUNTER — Ambulatory Visit (HOSPITAL_COMMUNITY)
Admission: RE | Admit: 2022-08-31 | Discharge: 2022-08-31 | Disposition: A | Discharge status: Home / Self Care | Payer: Medicare HMO | Source: Ambulatory Visit | Attending: Interventional Radiology | Admitting: Interventional Radiology

## 2022-08-31 ENCOUNTER — Encounter: Payer: Self-pay | Admitting: Nurse Practitioner

## 2022-08-31 VITALS — BP 125/71 | HR 76 | Temp 98.1°F | Resp 20 | Ht 64.0 in | Wt 217.0 lb

## 2022-08-31 DIAGNOSIS — Z434 Encounter for attention to other artificial openings of digestive tract: Secondary | ICD-10-CM | POA: Insufficient documentation

## 2022-08-31 DIAGNOSIS — C240 Malignant neoplasm of extrahepatic bile duct: Secondary | ICD-10-CM | POA: Diagnosis not present

## 2022-08-31 DIAGNOSIS — C221 Intrahepatic bile duct carcinoma: Secondary | ICD-10-CM

## 2022-08-31 HISTORY — PX: IR EXCHANGE BILIARY DRAIN: IMG6046

## 2022-08-31 HISTORY — PX: IR CHOLANGIOGRAM EXISTING TUBE: IMG6040

## 2022-08-31 LAB — CMP (CANCER CENTER ONLY)
ALT: 20 U/L (ref 0–44)
AST: 30 U/L (ref 15–41)
Albumin: 2.8 g/dL — ABNORMAL LOW (ref 3.5–5.0)
Alkaline Phosphatase: 572 U/L — ABNORMAL HIGH (ref 38–126)
Anion gap: 9 (ref 5–15)
BUN: 20 mg/dL (ref 8–23)
CO2: 26 mmol/L (ref 22–32)
Calcium: 8.8 mg/dL — ABNORMAL LOW (ref 8.9–10.3)
Chloride: 94 mmol/L — ABNORMAL LOW (ref 98–111)
Creatinine: 1.13 mg/dL — ABNORMAL HIGH (ref 0.44–1.00)
GFR, Estimated: 49 mL/min — ABNORMAL LOW (ref >60)
Glucose, Bld: 154 mg/dL — ABNORMAL HIGH (ref 70–99)
Potassium: 4.2 mmol/L (ref 3.5–5.1)
Sodium: 129 mmol/L — ABNORMAL LOW (ref 135–145)
Total Bilirubin: 3 mg/dL — ABNORMAL HIGH (ref 0.3–1.2)
Total Protein: 7 g/dL (ref 6.5–8.1)

## 2022-08-31 MED ORDER — LIDOCAINE HCL 1 % IJ SOLN
INTRAMUSCULAR | Status: DC | PRN
  Administered 2022-08-31: 10 mL via INTRADERMAL

## 2022-08-31 MED ORDER — LIDOCAINE HCL 1 % IJ SOLN
INTRAMUSCULAR | Status: AC
  Filled 2022-08-31: qty 20

## 2022-08-31 MED ORDER — IOHEXOL 300 MG/ML  SOLN
50.0000 mL | Freq: Once | INTRAMUSCULAR | Status: AC | PRN
  Administered 2022-08-31: 40 mL

## 2022-08-31 NOTE — Progress Notes (Signed)
Patient 3 biliary drains sites are draining and dressing had being change.

## 2022-08-31 NOTE — Procedures (Signed)
Interventional Radiology Procedure Note  Procedure: 1. Left drain cholangiogram.  2. Right biliary drain exchange x2  Indication: Cholangiocarcinoma.  Leaking around biliary drains.  Findings: Please refer to procedural dictation for full description.  Complications: None  EBL: < 10 mL  Miachel Roux, MD [phone]

## 2022-08-31 NOTE — Progress Notes (Signed)
Barbara Byrd OFFICE PROGRESS NOTE   Diagnosis: Cholangiocarcinoma  INTERVAL HISTORY:   Barbara Byrd returns as scheduled.  She was seen in the emergency department 08/29/2022 with leakage around the drains.  Bilirubin returned at 3.2.  2 drains had been capped a few days prior.  These drains were reattached to drainage bags.  She continues to have excessive drainage around the drain sites.  Dressings are "saturated".  She is having to change clothes due to the drainage.  Objective:  Vital signs in last 24 hours:  Blood pressure 125/71, pulse 76, temperature 98.1 F (36.7 C), resp. rate 20, height '5\' 4"'$  (1.626 m), weight 217 lb (98.4 kg), SpO2 98 %.    Resp: Lungs clear bilaterally. Cardio: Regular rate and rhythm. GI: Abdomen soft and nontender.  No hepatosplenomegaly.  Left and right biliary drains with surrounding gauze saturated. Vascular: No leg edema. Neuro: Alert and oriented.   Lab Results:  Lab Results  Component Value Date   WBC 7.6 07/28/2022   HGB 13.2 07/28/2022   HCT 41.2 07/28/2022   MCV 87.5 07/28/2022   PLT 257 07/28/2022   NEUTROABS 5.4 07/28/2022    Imaging:  No results found.  Medications: I have reviewed the patient's current medications.  Assessment/Plan: Common bile duct cholangiocarcinoma-stage IIb,pT2pN1 Presenting with obstructive jaundice summer 2019 Elevated preoperative CA 19-9 ERCP/EUS 09/02/2018-stenosis and lower third of the main bile duct, mass in the uncinate of the pancreas,-biopsy revealed minute focus of atypical cells Pancreaticoduodenectomy 11/15/2018,pT2pN1 moderately differentiated adenocarcinoma of the common bile duct, 2/35 lymph nodes, perineural invasion Patient reports 6 months of adjuvant gemcitabine/capecitabine Seen with complaint of abdominal pain 02/23/2022, CA 19-9 elevated, referred for CTs CTs 02/27/2022 status post Whipple without specific evidence of recurrent or metastatic disease.  Tiny pulmonary  nodules not readily apparent on the outside CT of 07/27/2019.  No thoracic adenopathy. Enlarged heterogeneous left lobe of the thyroid grossly similar to the prior exam. PET 4/98/2641-RAXENMMHWK hypermetabolic liver lesions consistent with metastases, mild uptake involving a right common iliac lymph node MRI abdomen 05/17/2022-lesions in the right liver noted on PET are not well defined on MRI, left and right intrahepatic biliary dilatation, suggesting obstruction at the confluence of the left and right hepatic ducts, hypoenhancing lesion at this level measuring 10 x 12 mm.  Central hypermetabolic activity in this region on the PET, no evidence of nodal disease 06/02/2022 cholangiogram and placement of a biliary drain by interventional radiology-central intrahepatic biliary dilatation but minimal peripheral biliary dilatation; right hepatic lobe demonstrated irregular dilated central bile ducts with drainage through the surgical anastomosis into the small bowel; narrowing at the surgical anastomosis; dilated left hepatic bile duct demonstrated filling defects within the bile duct; contrast eventually drained into the small bowel through the hepaticojejunostomy.  Cytology on the biliary tract, bile, biliary drain suspicious for malignancy. 06/15/2022-cholangiogram confirmed central left biliary stenosis, biliary brush biopsy-adenocarcinoma, left biliary drain exchanged and upsized  CT abdomen/pelvis 06/27/2022-numerous small pulmonary nodules in the included bilateral lung bases which are new and enlarged compared to exam dated 02/27/2022.  Interval placement of left-sided percutaneous biliary drain.  Similar intrahepatic biliary ductal dilatation when compared to prior MRI most notable left lobe of the liver. 07/02/2022 biliary drain exchanged on the left and new drain placed on the right 07/28/2022 new right biliary internal/external drain placed; exchange of prior left and right internal/external biliary  drain. 08/26/2022-right inferior biliary drain placed back to bag drainage.  Right superior and left biliary drains patent,  capped. 2.  Hypertension 3.  Hyperlipidemia 4.  COVID-19 2021 5.  06/02/2022 admitted with rigors, bradycardia, hypotension, nausea/vomiting following placement of the biliary drain, likely acute cholangitis, blood culture positive for E. coli, discharged home on levofloxacin    Disposition: Barbara Byrd appears unchanged.  Bilirubin is stable at 3.0.  She is having excessive drainage around the biliary drains despite all drains being uncapped and placed to bag drainage.  We are contacting interventional radiology for assistance.  She will return for follow-up in 3 to 4 weeks.  Plan reviewed with Dr. Benay Spice.    Ned Card ANP/GNP-BC   08/31/2022  10:03 AM

## 2022-09-02 ENCOUNTER — Other Ambulatory Visit (HOSPITAL_COMMUNITY): Payer: Medicare HMO

## 2022-09-14 ENCOUNTER — Other Ambulatory Visit (HOSPITAL_COMMUNITY): Payer: Self-pay | Admitting: Interventional Radiology

## 2022-09-15 ENCOUNTER — Ambulatory Visit (HOSPITAL_COMMUNITY)
Admission: RE | Admit: 2022-09-15 | Discharge: 2022-09-15 | Disposition: A | Discharge status: Home / Self Care | Payer: Medicare HMO | Source: Ambulatory Visit | Attending: Interventional Radiology | Admitting: Interventional Radiology

## 2022-09-15 ENCOUNTER — Other Ambulatory Visit (HOSPITAL_COMMUNITY): Payer: Self-pay | Admitting: Interventional Radiology

## 2022-09-15 DIAGNOSIS — Z434 Encounter for attention to other artificial openings of digestive tract: Secondary | ICD-10-CM | POA: Insufficient documentation

## 2022-09-15 HISTORY — PX: IR EXCHANGE BILIARY DRAIN: IMG6046

## 2022-09-15 MED ORDER — LIDOCAINE HCL 1 % IJ SOLN
INTRAMUSCULAR | Status: AC
  Administered 2022-09-15: 5 mL
  Filled 2022-09-15: qty 20

## 2022-09-15 MED ORDER — LIDOCAINE HCL 1 % IJ SOLN
INTRAMUSCULAR | Status: DC | PRN
  Administered 2022-09-15: 10 mL via INTRADERMAL

## 2022-09-15 MED ORDER — IOHEXOL 300 MG/ML  SOLN
50.0000 mL | Freq: Once | INTRAMUSCULAR | Status: AC | PRN
  Administered 2022-09-15: 15 mL

## 2022-09-15 NOTE — Procedures (Signed)
Interventional Radiology Procedure Note  Procedure:   Imaging of 3 int/ext biliary drain.  Exchange of all 3 int/ext biliary drain.  Current stock allows only 41F int/ext biliary of the superior right and the left drain, and a 34F of the lower right int/ext drain.   Complications: None  Recommendations: - Routine drain care, of the 3 int/ext biliary drain - routine wound care  Signed,  Dulcy Fanny. Earleen Newport, DO

## 2022-09-27 IMAGING — MR MR ABDOMEN WO/W CM
18 series · 48 of 48 positions shown · IV contrast (10 ML GADAVIST)
Comparison: None Available.

CLINICAL DATA: Cholangiocarcinoma.  Metastatic disease evaluation

EXAM:
MRI ABDOMEN WITHOUT AND WITH CONTRAST
TECHNIQUE: Multiplanar multisequence MR imaging of the abdomen was performed
both before and after the administration of intravenous contrast.
CONTRAST:  10mL GADAVIST GADOBUTROL 1 MMOL/ML IV SOLN

[Series 3: T2 fat-sat · axial · 6.0mm · 1.34mm/px · z∈[-175,+156]mm · 2 of 47 slices shown]
[im 1/47]
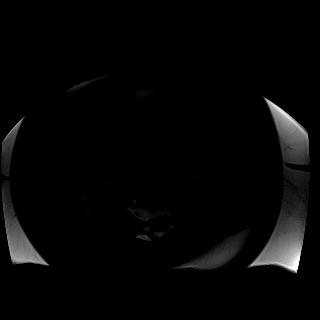
[im 47/47]
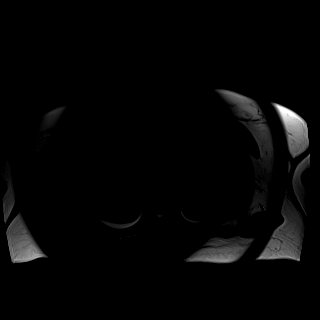

[Series 5: DWI · axial · 6.0mm · 1.60mm/px · z∈[-177,+168]mm · 4 of 98 slices shown (1 of 2)]
[im 1/98]
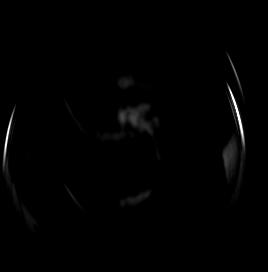
[im 33/98]
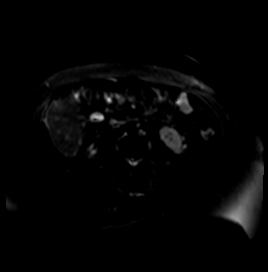
[im 65/98]
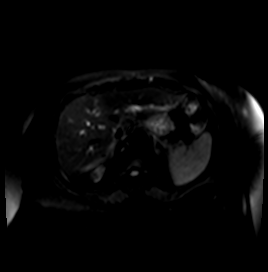
[im 98/98]
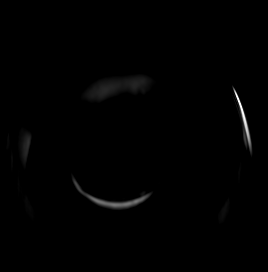

[Series 6: DWI · axial · 6.0mm · 1.60mm/px · z∈[-177,+168]mm · 2 of 46 slices shown (2 of 2)]
[im 1/46]
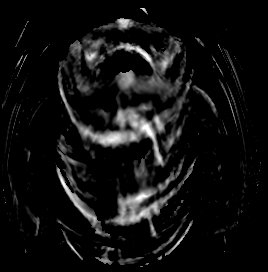
[im 46/46]
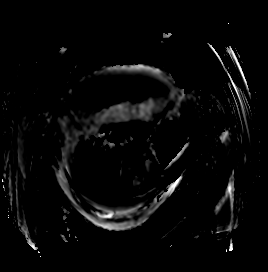

[Series 8: T2 · coronal · 6.0mm · 1.60mm/px · 1 of 39 slices shown (1 of 2)]
[im 1/39]
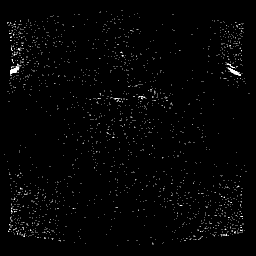

[Series 9: T1 · axial · 3.5mm · 1.34mm/px · z∈[-178,+155]mm · 3 of 96 slices shown (1 of 2)]
[im 1/96]
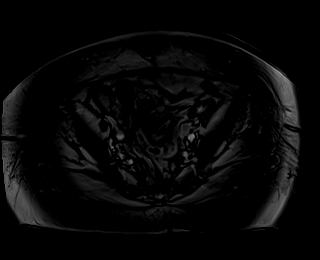
[im 48/96]
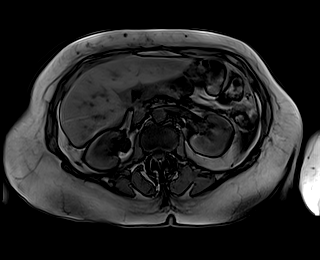
[im 96/96]
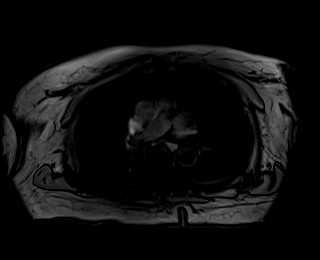

[Series 10: T1 · axial · 3.5mm · 1.34mm/px · z∈[-178,+155]mm · 3 of 96 slices shown (2 of 2)]
[im 1/96]
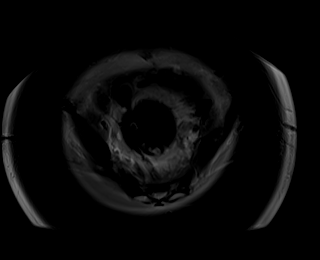
[im 48/96]
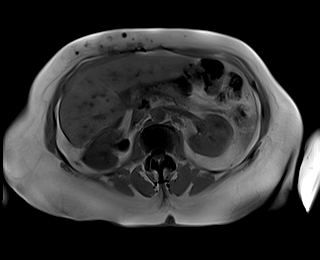
[im 96/96]
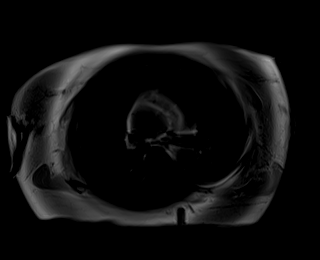

[Series 11: bSSFP · axial · 4.0mm · 0.84mm/px · z∈[-173,+159]mm · 3 of 84 slices shown]
[im 1/84]
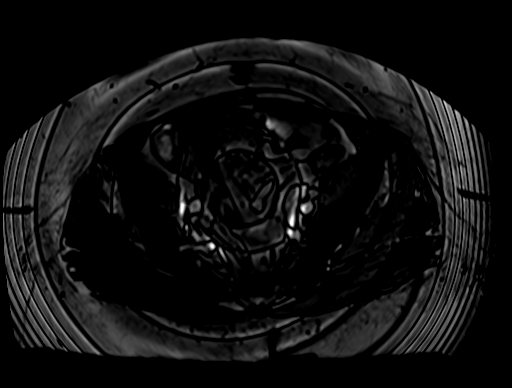
[im 42/84]
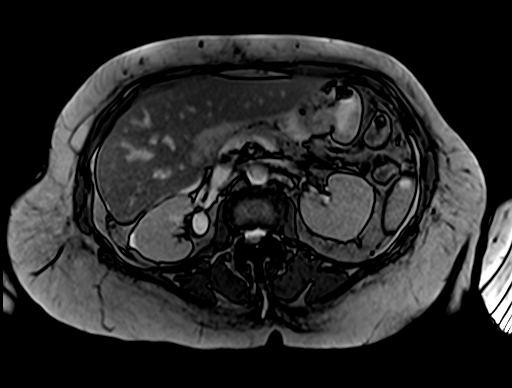
[im 84/84]
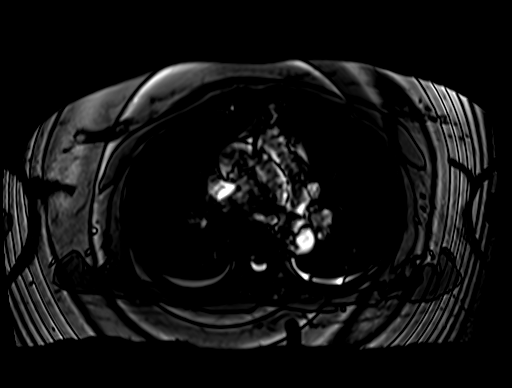

[Series 13: T1 dynamic · axial · 3.0mm · 1.34mm/px · z∈[-173,+160]mm · 3 of 112 slices shown (1 of 10)]
[im 1/112]
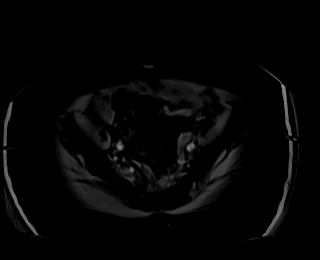
[im 56/112]
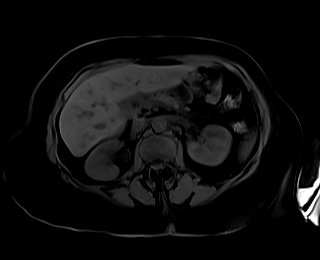
[im 112/112]
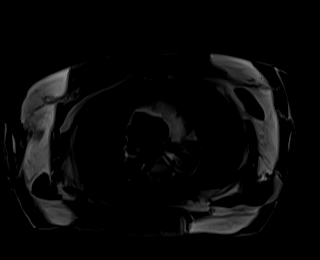

[Series 17: T1 dynamic · axial · 3.0mm · 1.34mm/px · z∈[-173,+160]mm · 3 of 107 slices shown (2 of 10)]
[im 1/107]
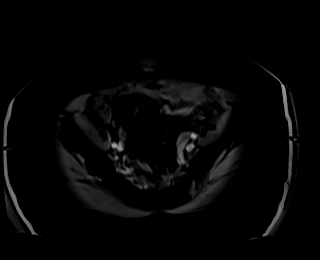
[im 54/107]
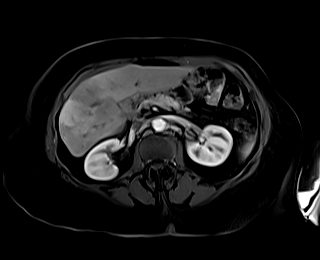
[im 107/107]
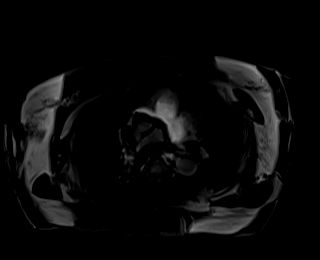

[Series 18: T1 dynamic · axial · 3.0mm · 1.34mm/px · z∈[-173,+160]mm · 3 of 112 slices shown (3 of 10)]
[im 1/112]
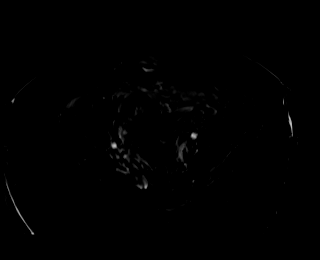
[im 56/112]
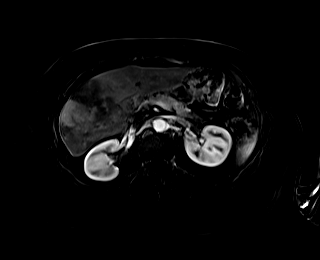
[im 112/112]
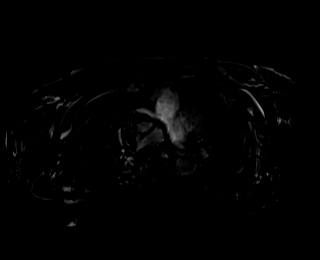

[Series 21: T1 dynamic · axial · 3.0mm · 1.34mm/px · z∈[-173,+160]mm · 3 of 112 slices shown (4 of 10)]
[im 1/112]
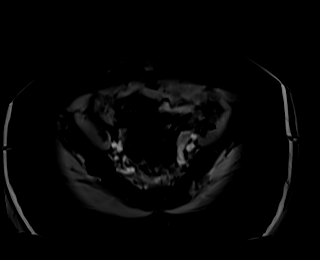
[im 56/112]
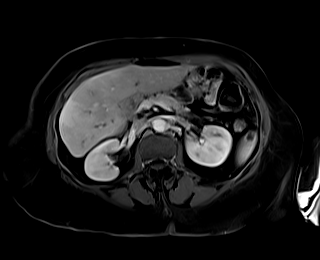
[im 112/112]
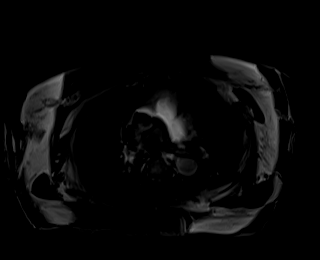

[Series 22: T1 dynamic · axial · 3.0mm · 1.34mm/px · z∈[-173,+160]mm · 3 of 112 slices shown (5 of 10)]
[im 1/112]
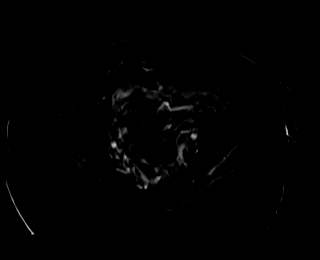
[im 56/112]
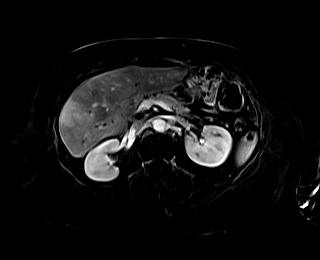
[im 112/112]
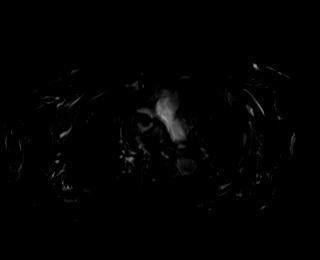

[Series 25: T1 dynamic · axial · 3.0mm · 1.34mm/px · z∈[-173,+160]mm · 3 of 112 slices shown (6 of 10)]
[im 1/112]
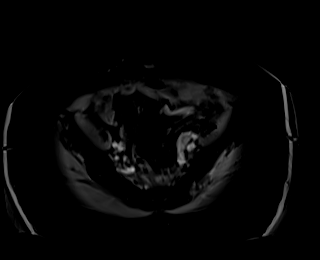
[im 56/112]
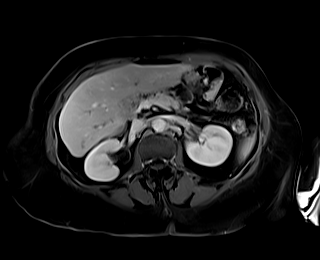
[im 112/112]
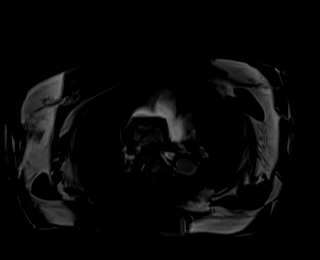

[Series 26: T1 dynamic · axial · 3.0mm · 1.34mm/px · z∈[-173,+160]mm · 3 of 112 slices shown (7 of 10)]
[im 1/112]
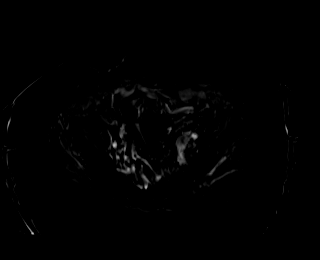
[im 56/112]
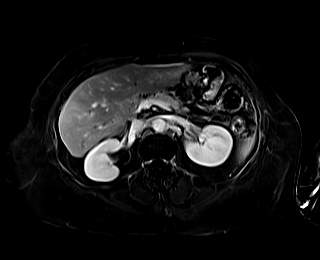
[im 112/112]
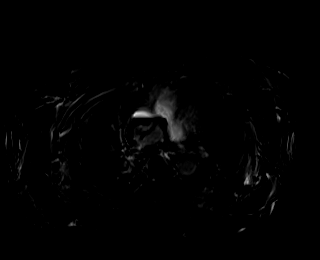

[Series 28: T1 dynamic · coronal · 5.0mm · 1.41mm/px · 2 of 56 slices shown (8 of 10)]
[im 1/56]
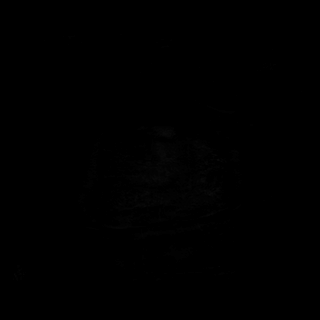
[im 56/56]
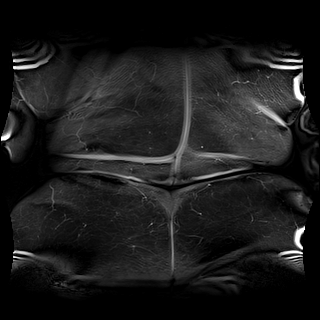

[Series 29: T2 · axial · 6.0mm · 1.68mm/px · 1 of 47 slices shown (2 of 2)]
[im 1/47]
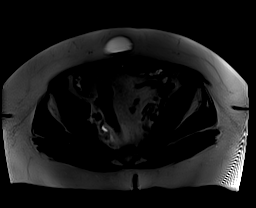

[Series 32: T1 dynamic · axial · 3.0mm · 1.34mm/px · z∈[-173,+160]mm · 3 of 112 slices shown (9 of 10)]
[im 1/112]
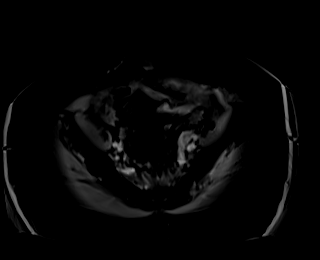
[im 56/112]
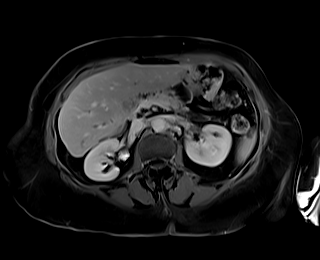
[im 112/112]
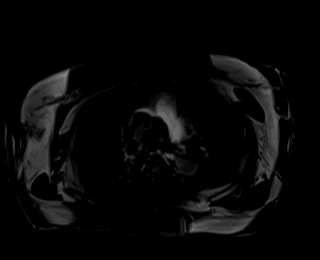

[Series 33: T1 dynamic · axial · 3.0mm · 1.34mm/px · z∈[-173,+160]mm · 3 of 108 slices shown (10 of 10)]
[im 1/108]
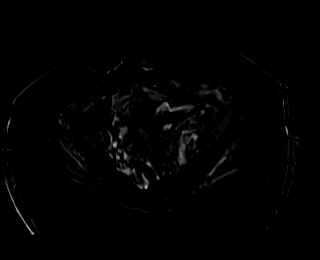
[im 54/108]
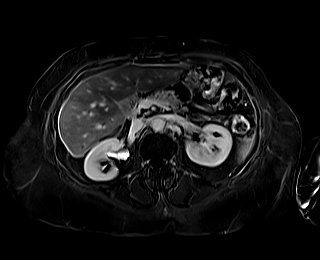
[im 108/108]
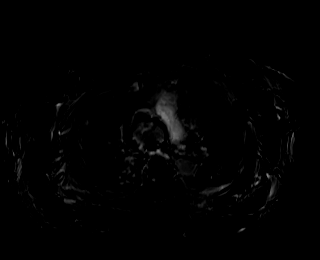

[48 of 48 positions shown; findings below may reference images not displayed]

FINDINGS: Lower chest:  Lung bases are clear.

Hepatobiliary: The lesions in the posterior RIGHT hepatic lobe and
central RIGHT hepatic lobe described on comparison FDG PET scan are
less well-defined by MRI imaging. There is clearly intrahepatic duct
dilatation involving the LEFT and RIGHT hepatic ducts. This ductal
dilatation is greater on the LEFT as seen on series 25 and series
28. This suggest obstructing lesion at the confluence of the LEFT
and RIGHT hepatic ducts. there is a rounded lesion at this level
which is hypoenhancing measuring 10 mm x 12 mm (image 52/series 17).
There is central hypermetabolic activity within this region on
comparison FDG PET scan.

Pancreas: Normal pancreatic parenchymal intensity. No ductal
dilatation or inflammation.

Spleen: Normal spleen.

Adrenals/urinary tract: Adrenal glands and kidneys are normal.

Stomach/Bowel: Stomach and limited of the small bowel is
unremarkable

Vascular/Lymphatic: Abdominal aortic normal caliber. No
retroperitoneal periportal lymphadenopathy. Cyst in the retrocrural
space measures 15 mm (image 18/series 29). No enhancement
postcontrast imaging. This lesion was not hypermetabolic on PET.

Musculoskeletal: No aggressive osseous lesion
IMPRESSION: 1. Discordant findings with the FDG PET scan. MRI findings are more
typical of a Klatskin type tumor with obstruction centrally at the
confluence LEFT and RIGHT hepatic duct with greater obstruction on
the LEFT. The more extensive hypermetabolic metastatic disease
described in the posterior RIGHT hepatic lobe on comparison FDG PET
scan is not identified on current MRI. There is hypermetabolic
activity centrally in the porta hepatis on comparison FDG PET scan.
2. No evidence of nodal disease.

## 2022-09-28 ENCOUNTER — Other Ambulatory Visit: Payer: Self-pay

## 2022-09-28 ENCOUNTER — Inpatient Hospital Stay (HOSPITAL_COMMUNITY)
Admit: 2022-09-28 | Discharge: 2022-10-02 | DRG: 682 | Disposition: A | Discharge status: Hospice | Payer: Medicare HMO | Source: Ambulatory Visit | Attending: Family Medicine | Admitting: Family Medicine

## 2022-09-28 ENCOUNTER — Inpatient Hospital Stay: Payer: Medicare HMO | Attending: Oncology | Admitting: Oncology

## 2022-09-28 ENCOUNTER — Other Ambulatory Visit (HOSPITAL_COMMUNITY): Payer: Self-pay

## 2022-09-28 ENCOUNTER — Telehealth: Payer: Self-pay

## 2022-09-28 ENCOUNTER — Inpatient Hospital Stay: Payer: Medicare HMO

## 2022-09-28 ENCOUNTER — Encounter (HOSPITAL_COMMUNITY): Payer: Self-pay

## 2022-09-28 ENCOUNTER — Encounter (HOSPITAL_COMMUNITY): Payer: Self-pay | Admitting: Internal Medicine

## 2022-09-28 VITALS — BP 107/67 | HR 80 | Temp 98.1°F | Resp 18 | Ht 64.0 in

## 2022-09-28 DIAGNOSIS — Z79899 Other long term (current) drug therapy: Secondary | ICD-10-CM

## 2022-09-28 DIAGNOSIS — I129 Hypertensive chronic kidney disease with stage 1 through stage 4 chronic kidney disease, or unspecified chronic kidney disease: Secondary | ICD-10-CM | POA: Diagnosis present

## 2022-09-28 DIAGNOSIS — R627 Adult failure to thrive: Secondary | ICD-10-CM | POA: Diagnosis present

## 2022-09-28 DIAGNOSIS — Z66 Do not resuscitate: Secondary | ICD-10-CM | POA: Diagnosis not present

## 2022-09-28 DIAGNOSIS — Z808 Family history of malignant neoplasm of other organs or systems: Secondary | ICD-10-CM | POA: Diagnosis not present

## 2022-09-28 DIAGNOSIS — E785 Hyperlipidemia, unspecified: Secondary | ICD-10-CM | POA: Diagnosis present

## 2022-09-28 DIAGNOSIS — R309 Painful micturition, unspecified: Secondary | ICD-10-CM | POA: Diagnosis not present

## 2022-09-28 DIAGNOSIS — Z6836 Body mass index (BMI) 36.0-36.9, adult: Secondary | ICD-10-CM

## 2022-09-28 DIAGNOSIS — Z8616 Personal history of COVID-19: Secondary | ICD-10-CM | POA: Diagnosis not present

## 2022-09-28 DIAGNOSIS — C787 Secondary malignant neoplasm of liver and intrahepatic bile duct: Secondary | ICD-10-CM | POA: Diagnosis present

## 2022-09-28 DIAGNOSIS — L89153 Pressure ulcer of sacral region, stage 3: Secondary | ICD-10-CM | POA: Diagnosis present

## 2022-09-28 DIAGNOSIS — Z8042 Family history of malignant neoplasm of prostate: Secondary | ICD-10-CM | POA: Diagnosis not present

## 2022-09-28 DIAGNOSIS — E860 Dehydration: Secondary | ICD-10-CM | POA: Diagnosis present

## 2022-09-28 DIAGNOSIS — N179 Acute kidney failure, unspecified: Secondary | ICD-10-CM | POA: Diagnosis present

## 2022-09-28 DIAGNOSIS — D72829 Elevated white blood cell count, unspecified: Secondary | ICD-10-CM | POA: Diagnosis present

## 2022-09-28 DIAGNOSIS — E669 Obesity, unspecified: Secondary | ICD-10-CM | POA: Diagnosis present

## 2022-09-28 DIAGNOSIS — I471 Supraventricular tachycardia, unspecified: Secondary | ICD-10-CM | POA: Diagnosis present

## 2022-09-28 DIAGNOSIS — I10 Essential (primary) hypertension: Secondary | ICD-10-CM | POA: Diagnosis present

## 2022-09-28 DIAGNOSIS — I959 Hypotension, unspecified: Secondary | ICD-10-CM | POA: Diagnosis present

## 2022-09-28 DIAGNOSIS — C221 Intrahepatic bile duct carcinoma: Secondary | ICD-10-CM | POA: Diagnosis present

## 2022-09-28 DIAGNOSIS — E871 Hypo-osmolality and hyponatremia: Secondary | ICD-10-CM | POA: Diagnosis present

## 2022-09-28 DIAGNOSIS — Z9689 Presence of other specified functional implants: Secondary | ICD-10-CM | POA: Diagnosis present

## 2022-09-28 DIAGNOSIS — Z515 Encounter for palliative care: Secondary | ICD-10-CM

## 2022-09-28 DIAGNOSIS — Z8505 Personal history of malignant neoplasm of liver: Secondary | ICD-10-CM

## 2022-09-28 DIAGNOSIS — Z90411 Acquired partial absence of pancreas: Secondary | ICD-10-CM

## 2022-09-28 DIAGNOSIS — Z7189 Other specified counseling: Secondary | ICD-10-CM | POA: Diagnosis not present

## 2022-09-28 DIAGNOSIS — Z8249 Family history of ischemic heart disease and other diseases of the circulatory system: Secondary | ICD-10-CM

## 2022-09-28 DIAGNOSIS — N1831 Chronic kidney disease, stage 3a: Secondary | ICD-10-CM | POA: Diagnosis present

## 2022-09-28 DIAGNOSIS — L8990 Pressure ulcer of unspecified site, unspecified stage: Secondary | ICD-10-CM | POA: Insufficient documentation

## 2022-09-28 DIAGNOSIS — Z823 Family history of stroke: Secondary | ICD-10-CM | POA: Diagnosis not present

## 2022-09-28 DIAGNOSIS — R5381 Other malaise: Secondary | ICD-10-CM | POA: Diagnosis present

## 2022-09-28 DIAGNOSIS — Z8507 Personal history of malignant neoplasm of pancreas: Secondary | ICD-10-CM

## 2022-09-28 MED ORDER — ACETAMINOPHEN 650 MG RE SUPP
650.0000 mg | Freq: Four times a day (QID) | RECTAL | Status: DC | PRN
  Filled 2022-09-28: qty 1

## 2022-09-28 MED ORDER — ENOXAPARIN SODIUM 40 MG/0.4ML IJ SOSY
40.0000 mg | PREFILLED_SYRINGE | INTRAMUSCULAR | Status: DC
  Administered 2022-09-28: 40 mg via SUBCUTANEOUS
  Filled 2022-09-28: qty 0.4

## 2022-09-28 MED ORDER — SODIUM CHLORIDE 0.9% FLUSH: 3.0000 mL | Freq: Two times a day (BID) | INTRAVENOUS | Status: DC

## 2022-09-28 MED ORDER — ROSUVASTATIN CALCIUM 5 MG PO TABS: 5.0000 mg | ORAL_TABLET | Freq: Every day | ORAL | Status: DC

## 2022-09-28 MED ORDER — POLYETHYLENE GLYCOL 3350 17 G PO PACK: 17.0000 g | PACK | Freq: Every day | ORAL | Status: DC | PRN

## 2022-09-28 MED ORDER — ACETAMINOPHEN 325 MG PO TABS: 650.0000 mg | ORAL_TABLET | Freq: Four times a day (QID) | ORAL | Status: DC | PRN

## 2022-09-28 MED ORDER — SODIUM CHLORIDE 0.9 % IV SOLN: INTRAVENOUS | Status: DC

## 2022-09-28 NOTE — Progress Notes (Signed)
[facility] OFFICE PROGRESS NOTE   Diagnosis: Cholangiocarcinoma  INTERVAL HISTORY:   Ms. Linam returns prior to scheduled visit.  She is here with her sister.  Ms. Winget was lethargic.  Her sister reports her appetite and liquid intake have diminished over the past week.  She has nausea and vomiting.  No fever.  She is having bowel movements.  Objective:  Vital signs in last 24 hours:  Blood pressure 107/67, pulse 80, temperature 98.1 F (36.7 C), temperature source Oral, resp. rate 18, height '5\' 4"'$  (1.626 m), SpO2 100 %.    HEENT: Clear no icterus, white coat over the tongue, no buccal thrush Resp: Lungs clear anteriorly Cardio: Regular rate and rhythm GI: Soft, right and left upper quadrant biliary drain sites without evidence of infection Vascular: No leg edema Neuro: Lethargic, arousable and speaks, stood and ambulated a few steps to the exam table with assistance Skin: Diminished skin turgor  Lab Results:  Lab Results  Component Value Date   WBC 7.6 07/28/2022   HGB 13.2 07/28/2022   HCT 41.2 07/28/2022   MCV 87.5 07/28/2022   PLT 257 07/28/2022   NEUTROABS 5.4 07/28/2022    CMP  Lab Results  Component Value Date   NA 129 (L) 08/31/2022   K 4.2 08/31/2022   CL 94 (L) 08/31/2022   CO2 26 08/31/2022   GLUCOSE 154 (H) 08/31/2022   BUN 20 08/31/2022   CREATININE 1.13 (H) 08/31/2022   CALCIUM 8.8 (L) 08/31/2022   PROT 7.0 08/31/2022   ALBUMIN 2.8 (L) 08/31/2022   AST 30 08/31/2022   ALT 20 08/31/2022   ALKPHOS 572 (H) 08/31/2022   BILITOT 3.0 (H) 08/31/2022   GFRNONAA 49 (L) 08/31/2022    Lab Results  Component Value Date   QAS341 935 (H) 04/20/2022    No results found.  Medications: I have reviewed the patient's current medications.   Assessment/Plan: Common bile duct cholangiocarcinoma-stage IIb,pT2pN1 Presenting with obstructive jaundice summer 2019 Elevated preoperative CA 19-9 ERCP/EUS 09/02/2018-stenosis and lower third  of the main bile duct, mass in the uncinate of the pancreas,-biopsy revealed minute focus of atypical cells Pancreaticoduodenectomy 11/15/2018,pT2pN1 moderately differentiated adenocarcinoma of the common bile duct, 2/35 lymph nodes, perineural invasion Patient reports 6 months of adjuvant gemcitabine/capecitabine Seen with complaint of abdominal pain 02/23/2022, CA 19-9 elevated, referred for CTs CTs 02/27/2022 status post Whipple without specific evidence of recurrent or metastatic disease.  Tiny pulmonary nodules not readily apparent on the outside CT of 07/27/2019.  No thoracic adenopathy. Enlarged heterogeneous left lobe of the thyroid grossly similar to the prior exam. PET 9/62/2297-[id number] hypermetabolic liver lesions consistent with metastases, mild uptake involving a right common iliac lymph node MRI abdomen 05/17/2022-lesions in the right liver noted on PET are not well defined on MRI, left and right intrahepatic biliary dilatation, suggesting obstruction at the confluence of the left and right hepatic ducts, hypoenhancing lesion at this level measuring 10 x 12 mm.  Central hypermetabolic activity in this region on the PET, no evidence of nodal disease 06/02/2022 cholangiogram and placement of a biliary drain by interventional radiology-central intrahepatic biliary dilatation but minimal peripheral biliary dilatation; right hepatic lobe demonstrated irregular dilated central bile ducts with drainage through the surgical anastomosis into the small bowel; narrowing at the surgical anastomosis; dilated left hepatic bile duct demonstrated filling defects within the bile duct; contrast eventually drained into the small bowel through the hepaticojejunostomy.  Cytology on the biliary tract, bile, biliary drain suspicious for malignancy.  06/15/2022-cholangiogram confirmed central left biliary stenosis, biliary brush biopsy-adenocarcinoma, left biliary drain exchanged and upsized  CT abdomen/pelvis  06/27/2022-numerous small pulmonary nodules in the included bilateral lung bases which are new and enlarged compared to exam dated 02/27/2022.  Interval placement of left-sided percutaneous biliary drain.  Similar intrahepatic biliary ductal dilatation when compared to prior MRI most notable left lobe of the liver. 07/02/2022 biliary drain exchanged on the left and new drain placed on the right 07/28/2022 new right biliary internal/external drain placed; exchange of prior left and right internal/external biliary drain. 08/26/2022-right inferior biliary drain placed back to bag drainage.  Right superior and left biliary drains patent, capped. 09/15/2022-exchange and upsize of displaced left internal/external biliary drain and routine exchange of 2 right-sided internal/external biliary drains 2.  Hypertension 3.  Hyperlipidemia 4.  COVID-19 2021 5.  06/02/2022 admitted with rigors, bradycardia, hypotension, nausea/vomiting following placement of the biliary drain, likely acute cholangitis, blood culture positive for E. coli, discharged home on levofloxacin     Disposition: Ms. Isakson has a history of cholangiocarcinoma.  There is clinical evidence of progressive disease with liver disease lung nodules.  The liver disease is causing biliary obstruction, refractory to placement of multiple drains.  A biliary tract/hilar mass brushing on 06/15/2022 confirmed adenocarcinoma.  Ms. Munnerlyn presents today with failure to thrive, nausea/vomiting, and evidence of dehydration.  We started intravenous fluids at the cancer center.  She will be admitted for further evaluation.  I suspect her symptoms are due to disease progression with biliary obstruction.  I discussed the poor prognosis with Ms. Livingstone and her sister.  We discussed CODE STATUS.  She was not able to make a decision on CODE STATUS today.  I explained the probable recommendation for comfort care if her clinical status does not improve over the next few  days.  She agrees to hospital admission for supportive care and further diagnostic evaluation.  We did not obtain labs at the Cancer center today.  I discussed the case with Dr. Cyndia Skeeters.  He agrees to admit Ms. Bethel to the medical service.  Recommendations: Hospital admission for intravenous hydration and further evaluation of the nausea/vomiting Obtain CMP and magnesium on hospital admission Discuss CODE STATUS, and consider a hospice referral depending on her status over the next few days I will check on her 09/29/2022.  [name], MD  09/28/2022  4:02 PM

## 2022-09-28 NOTE — H&P (Addendum)
History and Physical   [place] CNO:[id number] DOB: 01-Feb-1941 DOA: 09/28/2022  PCP: Malena Peer, MD   Patient coming from: Oncology center  Chief Complaint: Failure to thrive  HPI: [name] is a 81 y.o. female with medical history significant of cholangiocarcinoma, hypertension, hyperlipidemia, SVT presenting with failure to thrive from oncology center.  Patient seen at oncology center earlier today.  Has had worsening generalized weakness and fatigue.  Decreased p.o. intake with nausea and vomiting.  Discussion was held at oncologist office that if patient fails to improve with his admission it may be time to consider comfort care given her advanced cholangiocarcinoma [place] refractory to biliary drain placement and worsening decline.  She denies fevers, chills, chest pain, shortness breath, Donnell pain, constipation, diarrhea.  ED Course: Vital signs at cancer center stable.  She received IV fluids there.  Lab work-up is to be initiated here.  Review of Systems: As per HPI otherwise all other systems reviewed and are negative.  Past Medical History:  Diagnosis Date   Family history of cancer of pituitary gland and craniopharyngeal duct    Family history of prostate cancer    Pancreatic cancer [facility])     Past Surgical History:  Procedure Laterality Date   IR BILIARY DRAIN PLACEMENT WITH CHOLANGIOGRAM  07/02/2022   IR CHOLANGIOGRAM EXISTING TUBE  06/15/2022   IR CHOLANGIOGRAM EXISTING TUBE  08/31/2022   IR ENDOLUMINAL BX OF BILIARY TREE  06/15/2022   IR EXCHANGE BILIARY DRAIN  07/02/2022   IR EXCHANGE BILIARY DRAIN  07/28/2022   IR EXCHANGE BILIARY DRAIN  07/28/2022   IR EXCHANGE BILIARY DRAIN  08/31/2022   IR EXCHANGE BILIARY DRAIN  08/31/2022   IR EXCHANGE BILIARY DRAIN  09/15/2022   IR EXCHANGE BILIARY DRAIN  09/15/2022   IR EXCHANGE BILIARY DRAIN  09/15/2022   IR INT EXT BILIARY DRAIN WITH CHOLANGIOGRAM  06/02/2022   IR INT EXT BILIARY DRAIN WITH CHOLANGIOGRAM   07/28/2022   IR RADIOLOGIST EVAL & MGMT  08/26/2022   IR REMOVAL TUN ACCESS W/ PORT W/O FL MOD SED  11/14/2021    Social History  reports that she has never smoked. She does not have any smokeless tobacco history on file. She reports current alcohol use. She reports that she does not use drugs.  No Known Allergies  Family History  Problem Relation Age of Onset   Heart disease Mother 56   Cerebral aneurysm Father 72   Brain cancer Brother 83       pituitary tumor   Stroke Maternal Grandmother    Brain cancer Nephew 38       pituitary gland   Prostate cancer Nephew 45  Reviewed on admission  Prior to Admission medications   Medication Sig Start Date End Date Taking? Authorizing Provider  amLODipine (NORVASC) 10 MG tablet Take 1 tablet by mouth daily.    [provider]  bisoprolol-hydrochlorothiazide (ZIAC) 5-6.25 MG tablet Take 1 tablet by mouth daily. 04/15/21   [provider]  potassium chloride (KLOR-CON) 10 MEQ tablet Take 1 tablet (10 mEq total) by mouth daily. 08/07/22   Owens Shark, NP  rosuvastatin (CRESTOR) 5 MG tablet Take 5 mg by mouth daily. 04/15/21   [provider]  Sodium Chloride Flush (NORMAL SALINE FLUSH) 0.9 % SOLN Use to flush biliary tube 3 times a day 08/11/22   Ladell Pier, MD  traMADol (ULTRAM) 50 MG tablet Take 1 tablet (50 mg total) by mouth every 6 (six) hours  as needed. 07/23/22   Ladell Pier, MD    Physical Exam: Vitals:   09/28/22 2126 09/28/22 2131  BP:  92/71  Pulse:  66  Resp:  18  Temp:  (!) 97.3 F (36.3 C)  TempSrc:  Oral  SpO2:  100%  Weight: 88.2 kg   Height: '5\' 4"'$  (1.626 m)     Physical Exam Constitutional:      General: She is not in acute distress.    Appearance: Normal appearance. She is obese.  HENT:     Head: Normocephalic and atraumatic.     Mouth/Throat:     Mouth: Mucous membranes are moist.     Pharynx: Oropharynx is clear.  Eyes:     General: Scleral icterus present.      Extraocular Movements: Extraocular movements intact.     Pupils: Pupils are equal, round, and reactive to light.  Cardiovascular:     Rate and Rhythm: Normal rate and regular rhythm.     Pulses: Normal pulses.     Heart sounds: Normal heart sounds.  Pulmonary:     Effort: Pulmonary effort is normal. No respiratory distress.     Breath sounds: Normal breath sounds.  Abdominal:     General: Bowel sounds are normal. There is no distension.     Palpations: Abdomen is soft.     Tenderness: There is no abdominal tenderness.     Comments: Biliary drain in place  Musculoskeletal:        General: No swelling or deformity.  Skin:    General: Skin is warm and dry.  Neurological:     General: No focal deficit present.     Mental Status: Mental status is at baseline.    Labs on Admission: I have personally reviewed following labs and imaging studies  CBC: No results for input(s): "WBC", "NEUTROABS", "HGB", "HCT", "MCV", "PLT" in the last 168 hours.  Basic Metabolic Panel: No results for input(s): "NA", "K", "CL", "CO2", "GLUCOSE", "BUN", "CREATININE", "CALCIUM", "MG", "PHOS" in the last 168 hours.  GFR: CrCl cannot be calculated (Patient's most recent lab result is older than the maximum 21 days allowed.).  Liver Function Tests: No results for input(s): "AST", "ALT", "ALKPHOS", "BILITOT", "PROT", "ALBUMIN" in the last 168 hours.  Urine analysis: No results found for: "COLORURINE", "APPEARANCEUR", "LABSPEC", "PHURINE", "GLUCOSEU", "HGBUR", "BILIRUBINUR", "KETONESUR", "PROTEINUR", "UROBILINOGEN", "NITRITE", "LEUKOCYTESUR"  Radiological Exams on Admission: No results found.  EKG: Not performed this admission  Assessment/Plan Principal Problem:   FTT (failure to thrive) in adult Active Problems:   Cholangiocarcinoma (HCC)   Hypertension   Hyperlipidemia   Pressure injury of skin   Adult failure to thrive Cholangiocarcinoma > Patient with history of of cholangiocarcinoma status  post pancreaticoduodenectomy in 2019, multiple rounds of chemotherapy, status post multiple biliary drain placements. > Presented to oncologist office with continued to decline and evidence of failure to thrive with decreased p.o. intake and nausea vomiting.  Direct admission requested by oncologist, Dr. Benay Spice. > Patient to be admitted for IV fluids, supportive care, laboratory evaluation. > Oncology began discussion with patient that if she fails to improve it may be time to consider comfort measures. - Monitor on MedSurg for now - We will check labs with CMP, magnesium, CBC - IV fluids - Supportive care - Palliative care consult  Pressure wound > Reported by nursing staff - Wound care as appropriate  Hypertension - Holding home amlodipine and bisoprolol-hydrochlorothiazide in the setting of low normal blood pressure  Hyperlipidemia -  Continue home rosuvastatin  DVT prophylaxis: Lovenox Code Status:   Full, wants to have ongoing discussions with family Family Communication:  None on admission.  She states they are aware of admission. Disposition Plan:   Patient is from:  Home  Anticipated DC to:  Pending clinical course  Anticipated DC date:  2 to 4 days  Anticipated DC barriers: Pending response and discussions about possible comfort care Consults called:  Oncology will see the patient.  Palliative care consult ordered. Admission status:  Inpatient, MedSurg  Severity of Illness: The appropriate patient status for this patient is INPATIENT. Inpatient status is judged to be reasonable and necessary in order to provide the required intensity of service to ensure the patient's safety. The patient's presenting symptoms, physical exam findings, and initial radiographic and laboratory data in the context of their chronic comorbidities is felt to place them at high risk for further clinical deterioration. Furthermore, it is not anticipated that the patient will be medically stable for  discharge from the hospital within 2 midnights of admission.   * I certify that at the point of admission it is my clinical judgment that the patient will require inpatient hospital care spanning beyond 2 midnights from the point of admission due to high intensity of service, high risk for further deterioration and high frequency of surveillance required.Marcelyn Bruins MD [facility]  How to contact the [facility] Attending or Consulting provider [place] or covering provider during after hours [place], for this patient?   Check the care team in [facility] and look for a) attending/consulting [facility] provider listed and b) the [facility] team listed Log into [email or web address] and use [place]'s universal password to access. If you do not have the password, please contact the hospital operator. Locate the [facility] provider you are looking for under [facility] and page to a number that you can be directly reached. If you still have difficulty reaching the provider, please page the [facility] (Director on Call) for the Hospitalists listed on amion for assistance.  09/28/2022, 11:06 PM

## 2022-09-28 NOTE — Telephone Encounter (Signed)
Barbara Byrd called and states she is very weak, not eating, and she cant get up out of the bed. Per Benay Spice an appointment was add  on at 20 and IVF.

## 2022-09-28 NOTE — Progress Notes (Signed)
Plan of Care Note for accepted transfer   Patient: Barbara Byrd MRN: [id number]   DOA: (Not on file)  Facility requesting transfer: [facility]. Requesting Provider: Dr. Franchot Erichsen. Reason for transfer: Dehydration/failure to thrive on a patient with advanced cholangiocarcinoma. Does not seem to be septic, but is very weak requiring multiple staff members for transfers. Facility course:  IV placement/ IV hydration just started and labs just drawn. [facility] will follow.  Plan of care: The patient is accepted for admission to [place]  unit, at [facility].  Author: Reubin Milan, MD 09/28/2022  Check [email or web address] for on-call coverage.  Nursing staff, Please call [place] number on Amion as soon as patient's arrival, so appropriate admitting provider can evaluate the pt.

## 2022-09-28 NOTE — Progress Notes (Signed)
Patient admitted to [place] in NAD. Aox4.  VSS. In no pain. MIVF running at 157m/hr. Patient has 3 biliary drains in place. Stage 3 PI noted on sacrum. Patient orient to room and call bell. All needs within reach. Fall precautions in place.

## 2022-09-29 ENCOUNTER — Inpatient Hospital Stay (HOSPITAL_COMMUNITY): Payer: Medicare HMO

## 2022-09-29 ENCOUNTER — Inpatient Hospital Stay: Payer: Self-pay

## 2022-09-29 DIAGNOSIS — Z515 Encounter for palliative care: Secondary | ICD-10-CM | POA: Diagnosis not present

## 2022-09-29 DIAGNOSIS — N179 Acute kidney failure, unspecified: Secondary | ICD-10-CM

## 2022-09-29 DIAGNOSIS — C221 Intrahepatic bile duct carcinoma: Secondary | ICD-10-CM | POA: Diagnosis not present

## 2022-09-29 DIAGNOSIS — R627 Adult failure to thrive: Secondary | ICD-10-CM | POA: Diagnosis not present

## 2022-09-29 DIAGNOSIS — E669 Obesity, unspecified: Secondary | ICD-10-CM

## 2022-09-29 DIAGNOSIS — D72829 Elevated white blood cell count, unspecified: Secondary | ICD-10-CM

## 2022-09-29 DIAGNOSIS — N1831 Chronic kidney disease, stage 3a: Secondary | ICD-10-CM | POA: Diagnosis not present

## 2022-09-29 DIAGNOSIS — Z7189 Other specified counseling: Secondary | ICD-10-CM

## 2022-09-29 LAB — CBC
HCT: 29.2 % — ABNORMAL LOW (ref 36.0–46.0)
Hemoglobin: 9.5 g/dL — ABNORMAL LOW (ref 12.0–15.0)
MCH: 28.5 pg (ref 26.0–34.0)
MCHC: 32.5 g/dL (ref 30.0–36.0)
MCV: 87.7 fL (ref 80.0–100.0)
Platelets: 335 10*3/uL (ref 150–400)
RBC: 3.33 MIL/uL — ABNORMAL LOW (ref 3.87–5.11)
RDW: 16.6 % — ABNORMAL HIGH (ref 11.5–15.5)
WBC: 20.8 10*3/uL — ABNORMAL HIGH (ref 4.0–10.5)
nRBC: 0 % (ref 0.0–0.2)

## 2022-09-29 LAB — MAGNESIUM: Magnesium: 2.1 mg/dL (ref 1.7–2.4)

## 2022-09-29 LAB — URINALYSIS, ROUTINE W REFLEX MICROSCOPIC
Bilirubin Urine: NEGATIVE
Glucose, UA: NEGATIVE mg/dL
Hgb urine dipstick: NEGATIVE
Ketones, ur: NEGATIVE mg/dL
Leukocytes,Ua: NEGATIVE
Nitrite: NEGATIVE
Protein, ur: 30 mg/dL — AB
Specific Gravity, Urine: 1.013 (ref 1.005–1.030)
pH: 5 (ref 5.0–8.0)

## 2022-09-29 LAB — COMPREHENSIVE METABOLIC PANEL
ALT: 21 U/L (ref 0–44)
AST: 27 U/L (ref 15–41)
Albumin: 1.9 g/dL — ABNORMAL LOW (ref 3.5–5.0)
Alkaline Phosphatase: 455 U/L — ABNORMAL HIGH (ref 38–126)
Anion gap: 10 (ref 5–15)
BUN: 56 mg/dL — ABNORMAL HIGH (ref 8–23)
CO2: 19 mmol/L — ABNORMAL LOW (ref 22–32)
Calcium: 8.4 mg/dL — ABNORMAL LOW (ref 8.9–10.3)
Chloride: 102 mmol/L (ref 98–111)
Creatinine, Ser: 2.71 mg/dL — ABNORMAL HIGH (ref 0.44–1.00)
GFR, Estimated: 17 mL/min — ABNORMAL LOW (ref >60)
Glucose, Bld: 103 mg/dL — ABNORMAL HIGH (ref 70–99)
Potassium: 4.4 mmol/L (ref 3.5–5.1)
Sodium: 131 mmol/L — ABNORMAL LOW (ref 135–145)
Total Bilirubin: 4.2 mg/dL — ABNORMAL HIGH (ref 0.3–1.2)
Total Protein: 7.3 g/dL (ref 6.5–8.1)

## 2022-09-29 LAB — SODIUM, URINE, RANDOM: Sodium, Ur: <10 mmol/L

## 2022-09-29 LAB — PROTEIN / CREATININE RATIO, URINE
Creatinine, Urine: 123 mg/dL
Protein Creatinine Ratio: 0.9 mg/mg{Cre} — ABNORMAL HIGH (ref 0.00–0.15)
Total Protein, Urine: 111 mg/dL

## 2022-09-29 LAB — CREATININE, URINE, RANDOM: Creatinine, Urine: 126 mg/dL

## 2022-09-29 MED ORDER — SODIUM CHLORIDE 0.9% FLUSH: 10.0000 mL | INTRAVENOUS | Status: DC | PRN

## 2022-09-29 MED ORDER — ENSURE ENLIVE PO LIQD
237.0000 mL | Freq: Two times a day (BID) | ORAL | Status: DC
  Administered 2022-09-29 – 2022-10-02 (×5): 237 mL via ORAL

## 2022-09-29 MED ORDER — ONDANSETRON HCL 8 MG PO TABS: 8.0000 mg | ORAL_TABLET | Freq: Three times a day (TID) | ORAL | Status: DC | PRN

## 2022-09-29 MED ORDER — CHLORHEXIDINE GLUCONATE CLOTH 2 % EX PADS
6.0000 | MEDICATED_PAD | Freq: Every day | CUTANEOUS | Status: DC
  Administered 2022-09-29 – 2022-10-01 (×3): 6 via TOPICAL

## 2022-09-29 MED ORDER — ENOXAPARIN SODIUM 30 MG/0.3ML IJ SOSY
30.0000 mg | PREFILLED_SYRINGE | INTRAMUSCULAR | Status: DC
  Administered 2022-09-29 – 2022-10-01 (×3): 30 mg via SUBCUTANEOUS
  Filled 2022-09-29 (×3): qty 0.3

## 2022-09-29 MED ORDER — OXYCODONE HCL 5 MG PO TABS
5.0000 mg | ORAL_TABLET | Freq: Four times a day (QID) | ORAL | Status: DC | PRN
  Administered 2022-10-01 – 2022-10-02 (×2): 5 mg via ORAL
  Filled 2022-09-29 (×2): qty 1

## 2022-09-29 MED ORDER — SODIUM CHLORIDE 0.9% FLUSH
10.0000 mL | Freq: Two times a day (BID) | INTRAVENOUS | Status: DC
  Administered 2022-09-29 – 2022-10-01 (×4): 10 mL

## 2022-09-29 MED ORDER — DRONABINOL 2.5 MG PO CAPS
2.5000 mg | ORAL_CAPSULE | Freq: Two times a day (BID) | ORAL | Status: DC
  Administered 2022-09-30 – 2022-10-02 (×5): 2.5 mg via ORAL
  Filled 2022-09-29 (×6): qty 1

## 2022-09-29 NOTE — Progress Notes (Signed)
Mobility Specialist - Progress Note Pt refused mobility, just got comfortable in the bed. Pt requested an ambulation session tomorrow. Will check back in if schedule permits.   Roderick Pee Mobility Specialist

## 2022-09-29 NOTE — Progress Notes (Signed)
Initial Nutrition Assessment  INTERVENTION:   -Ensure Plus High Protein po BID, each supplement provides 350 kcal and 20 grams of protein.    NUTRITION DIAGNOSIS:   Inadequate oral intake related to poor appetite as evidenced by estimated needs.  GOAL:   Patient will meet greater than or equal to 90% of their needs  MONITOR:   PO intake, Supplement acceptance, Labs, Weight trends, I & O's, Skin, GOC  REASON FOR ASSESSMENT:   Consult Assessment of nutrition requirement/status  ASSESSMENT:   81 y.o. female with medical history significant of cholangiocarcinoma, hypertension, hyperlipidemia, SVT presenting with failure to thrive  Patient in room, no family at bedside. Pt declines visit by RD. When asked why she didn't want to me to talk to her, she states "I'm not feeling well". Pt states she has not been eating well today. Per documentation: 0-5% meal completions. Will order Ensure supplements. Per chart review, pt is discussing possibly going comfort care with oncology.   Per weight records, pt has lost 44 lbs since 7/6 (18% wt loss x 3.5 months, significant for time frame). Suspect some degree of malnutrition but unable to diagnose at this time.  Medications reviewed.  Labs reviewed: Low Na   NUTRITION - FOCUSED PHYSICAL EXAM:  Pt declined.  Diet Order:   Diet Order             Diet regular Room service appropriate? Yes; Fluid consistency: Thin  Diet effective now                   EDUCATION NEEDS:   No education needs have been identified at this time  Skin:  Skin Assessment: Skin Integrity Issues: Skin Integrity Issues:: Stage III Stage III: sacrum  Last BM:  10/23  Height:   Ht Readings from Last 1 Encounters:  09/28/22 '5\' 4"'$  (1.626 m)    Weight:   Wt Readings from Last 1 Encounters:  09/28/22 88.2 kg    BMI:  Body mass index is 33.38 kg/m.  Estimated Nutritional Needs:   Kcal:  1400-1600  Protein:  65-75g  Fluid:   1.6L/day  Clayton Bibles, MS, RD, LDN Inpatient Clinical Dietitian Contact information available via Amion

## 2022-09-29 NOTE — Assessment & Plan Note (Addendum)
Likely secondary to metastatic disease. Palliative care consulted and patient has elected for hospice care at home.

## 2022-09-29 NOTE — Progress Notes (Addendum)
IP PROGRESS NOTE  Subjective:   Barbara Byrd reports feeling better.  No pain.  She reports burning with urination.  Objective: Vital signs in last 24 hours: Blood pressure 92/71, pulse 66, temperature (!) 97.3 F (36.3 C), temperature source Oral, resp. rate 18, height '5\' 4"'$  (1.626 m), weight 194 lb 7.1 oz (88.2 kg), SpO2 100 %.  Intake/Output from previous day: 10/23 0701 - 10/24 0700 In: 1009.6 [P.O.:120; I.V.:889.6] Out: 0   Physical Exam:  HEENT: Scleral icterus Lungs: Clear bilaterally, no respiratory distress Cardiac: Regular rate and rhythm Abdomen: Left and right upper abdominal biliary drains, the abdomen is soft Extremities: No leg edema   Lab Results: Recent Labs    09/29/22 0529  WBC 20.8*  HGB 9.5*  HCT 29.2*  PLT 335    BMET Recent Labs    09/29/22 0529  NA 131*  K 4.4  CL 102  CO2 19*  GLUCOSE 103*  BUN 56*  CREATININE 2.71*  CALCIUM 8.4*    Lab Results  Component Value Date   HAL937 935 (H) 04/20/2022    Studies/Results: No results found.  Medications: I have reviewed the patient's current medications.  Assessment/Plan: Common bile duct cholangiocarcinoma-stage IIb,pT2pN1 Presenting with obstructive jaundice summer 2019 Elevated preoperative CA 19-9 ERCP/EUS 09/02/2018-stenosis and lower third of the main bile duct, mass in the uncinate of the pancreas,-biopsy revealed minute focus of atypical cells Pancreaticoduodenectomy 11/15/2018,pT2pN1 moderately differentiated adenocarcinoma of the common bile duct, 2/35 lymph nodes, perineural invasion Patient reports 6 months of adjuvant gemcitabine/capecitabine Seen with complaint of abdominal pain 02/23/2022, CA 19-9 elevated, referred for CTs CTs 02/27/2022 status post Whipple without specific evidence of recurrent or metastatic disease.  Tiny pulmonary nodules not readily apparent on the outside CT of 07/27/2019.  No thoracic adenopathy. Enlarged heterogeneous left lobe of the thyroid grossly  similar to the prior exam. PET 08/08/4096-[id number] hypermetabolic liver lesions consistent with metastases, mild uptake involving a right common iliac lymph node MRI abdomen 05/17/2022-lesions in the right liver noted on PET are not well defined on MRI, left and right intrahepatic biliary dilatation, suggesting obstruction at the confluence of the left and right hepatic ducts, hypoenhancing lesion at this level measuring 10 x 12 mm.  Central hypermetabolic activity in this region on the PET, no evidence of nodal disease 06/02/2022 cholangiogram and placement of a biliary drain by interventional radiology-central intrahepatic biliary dilatation but minimal peripheral biliary dilatation; right hepatic lobe demonstrated irregular dilated central bile ducts with drainage through the surgical anastomosis into the small bowel; narrowing at the surgical anastomosis; dilated left hepatic bile duct demonstrated filling defects within the bile duct; contrast eventually drained into the small bowel through the hepaticojejunostomy.  Cytology on the biliary tract, bile, biliary drain suspicious for malignancy. 06/15/2022-cholangiogram confirmed central left biliary stenosis, biliary brush biopsy-adenocarcinoma, left biliary drain exchanged and upsized  CT abdomen/pelvis 06/27/2022-numerous small pulmonary nodules in the included bilateral lung bases which are new and enlarged compared to exam dated 02/27/2022.  Interval placement of left-sided percutaneous biliary drain.  Similar intrahepatic biliary ductal dilatation when compared to prior MRI most notable left lobe of the liver. 07/02/2022 biliary drain exchanged on the left and new drain placed on the right 07/28/2022 new right biliary internal/external drain placed; exchange of prior left and right internal/external biliary drain. 08/26/2022-right inferior biliary drain placed back to bag drainage.  Right superior and left biliary drains patent, capped. 09/15/2022-exchange  and upsize of displaced left internal/external biliary drain and routine exchange of 2  right-sided internal/external biliary drains 2.  Hypertension 3.  Hyperlipidemia 4.  COVID-19 2021 5.  06/02/2022 admitted with rigors, bradycardia, hypotension, nausea/vomiting following placement of the biliary drain, likely acute cholangitis, blood culture positive for E. coli, discharged home on levofloxacin 6.  Admission 09/28/2022 with nausea, failure to thrive, and dehydration   Barbara Byrd appears improved compared to when I saw her yesterday.  She is more alert.  Nausea appears improved.  She has persistent hyperbilirubinemia despite placement of multiple biliary drains and recent drain exchange.  The creatinine is elevated, likely reflecting dehydration and acute renal injury.  I agree with checking a urinalysis and blood cultures given the leukocytosis, biliary obstruction, report of dysuria, and evaded white count.  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  She does not appear to be a candidate for systemic therapy.  I discussed comfort care with her this morning.  I will continue these discussions over the next few days.  Recommendations: 1.  Continue intravenous hydration, follow-up creatinine tomorrow 2.  Check urine and blood cultures, antibiotics for a fever or symptoms of sepsis 3.  I will continue following her in the hospital and outpatient follow-up will be scheduled at the Cancer center        LOS: 1 day   [name], MD   09/29/2022, 7:11 AM

## 2022-09-29 NOTE — Progress Notes (Signed)
  Progress Note   Patient: Barbara Byrd WVP:[id number] DOB: January 20, 1941 DOA: 09/28/2022     1 DOS: the patient was seen and examined on 09/29/2022 at 10:55 AM      Brief hospital course: Barbara Byrd is a 81 y.o. F with HTN, HLD, obesity, CKD IIIa baseline 1.1 and cholangiocarcinoma s/p Whipple and repeat biliary drains now metastatic to liver, lymph nodes and lung, who presented with weakness, weight loss (35 kg since June)  and dehydration.    10/23: Seen in [place] clinic for routine follow up, appeared to be failing to thrive, requested direct admission for fluids, nutrition support 10/24: Found to have AKI Cr 2.7     Assessment and Plan: * Acute renal failure superimposed on stage 3a chronic kidney disease ([place]) Patient presented with poor oral intake, malaise, weakness.  Given fluids and observed overnight and this morning creatinine found to be 2.7 up from baseline 0.9-1.2 -Obtain renal ultrasound - Check urine protein, electrolytes, urinalysis - Continue IV fluids - Avoid hypotension and nephrotoxins - Strict ins and outs     Obesity (BMI 30-39.9) BMI 33  Leukocytosis Check urinalysis, blood cultures, urine culture.  No clear infection at this time so we will hold antibiotics.  Pressure injury of skin Stage III sacrum, present on admission  FTT (failure to thrive) in adult Patient has metastatic cholangiocarcinoma, is very elderly, now has renal failure in the setting of poor oral intake. -Consult palliative care  Hyperlipidemia - Hold Crestor  Hypertension Blood pressure soft - Hold amlodipine, bisoprolol, HCTZ  Cholangiocarcinoma (HCC) Total bilirubin and alk phos close to baseline.  No change.  She has 3 external biliary drains in place. - Consult palliative care          Subjective: Patient feels weak and tired, she has no fever, cough, urinary symptoms.  No abdominal pain.  No chills.     Physical Exam: BP (!) 112/54 (BP Location: Left  Arm)   Pulse 73   Temp 97.6 F (36.4 C) (Oral)   Resp 16   Ht $R'5\' 4"'Fi$  (1.626 m)   Wt 88.2 kg   SpO2 100%   BMI 33.38 kg/m   Elderly adult female, lying in bed, no acute distress, appears weak and tired RRR, no murmurs, no peripheral edema Respiratory rate normal, lungs clear without rales or wheezes Abdomen soft without tenderness palpation or guarding, no ascites or distention Attention normal, affect blunted, appears severely weak, sitting up in recliner, face symmetric, speech fluent    Data Reviewed: Basic metabolic panel shows creatinine 2.7, sodium 131 White blood cell count 20.8 Albumin 1.9, hemoglobin 9.5 Alk phos is elevated and total bilirubin is elevated but close to the recent baselines    Family Communication: None present    Disposition: Status is: Inpatient The patient was admitted for weakness and fatigue, found to have renal failure  This renal failure is in the setting of poor oral intake and probably failure to thrive in the setting of advanced cholangiocarcinoma  Palliative care consulted, will give fluids and see if we can improve her condition  The overall prognosis is poor, and if we are not able to improve her quality of life with fluids, reversing AKI, would be reasonable to approach Hospice        Author: Edwin Dada, MD 09/29/2022 8:38 PM  For on call review [email or web address].

## 2022-09-29 NOTE — Assessment & Plan Note (Addendum)
Baseline creatinine of about 1.2. Creatinine of 2.71 on admission, likely secondary to poor oral intake. Renal ultrasound unremarkable for etiology. Creatinine improved with IV fluids. Now hospice care.

## 2022-09-29 NOTE — Assessment & Plan Note (Addendum)
Patient is on bisoprolol-hydrochlorothiazide and amlodipine as an outpatient which were held secondary to soft blood pressure. Discontinue on discharge secondary to hypotension.

## 2022-09-29 NOTE — Assessment & Plan Note (Addendum)
Estimated body mass index is 36.25 kg/m as calculated from the following:   Height as of this encounter: '5\' 4"'$  (1.626 m).   Weight as of this encounter: 95.8 kg.

## 2022-09-29 NOTE — Assessment & Plan Note (Signed)
Stage III sacrum, present on admission

## 2022-09-29 NOTE — Assessment & Plan Note (Addendum)
No infectious source. Blood and urine cultures obtained and are pending. Afebrile. No symptoms per patient. No antibiotics initiated. Patient has now elected for hospice care. No further workup.

## 2022-09-29 NOTE — Hospital Course (Addendum)
Barbara Byrd is a 81 y.o. F with HTN, HLD, obesity, CKD IIIa baseline 1.1 and cholangiocarcinoma s/p Whipple and repeat biliary drains now metastatic to liver, lymph nodes and lung, who presented with weakness, weight loss (35 kg since June)  and dehydration.    10/23: Seen in [place] clinic for routine follow up, appeared to be failing to thrive, requested direct admission for fluids, nutrition support 10/24: Found to have AKI Cr 2.7

## 2022-09-29 NOTE — Consult Note (Signed)
Palliative Care Consult Note                                  Date: 09/29/2022   Patient Name: Barbara Byrd  DOB: Sep 30, 1941  MRN: [id number]  Age / Sex: 81 y.o., female  PCP: Chow, Bebe Shaggy, MD Referring Physician: Edwin Dada, *  Reason for Consultation: Establishing goals of care  HPI/Patient Profile: 81 y.o. female  with past medical history of metastatic cholangiocarcinoma, hypertension, hyperlipidemia, and SVT who presented to [facility] as a direct admit from the cancer center on 09/28/2022 with failure to thrive.  She is admitted to [facility] service.  Palliative Medicine has been consulted for goals of care  Past Medical History:  Diagnosis Date   Family history of cancer of pituitary gland and craniopharyngeal duct    Family history of prostate cancer    Pancreatic cancer ([place])     Subjective:   I have reviewed medical records including progress notes, labs and imaging.   I met with [place] and 2 of her 3 sisters [name] and [place]) at bedside today to discuss diagnosis, prognosis, GOC, EOL wishes, disposition, and options.  I introduced Palliative Medicine as specialized medical care for people living with serious illness. It focuses on providing relief from the symptoms and stress of a serious illness.   A brief life review was discussed. [place] was born and raised in [place] but spent much of her life in [place]. 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 She never married and does not have children.    [place] is 1 of 4 sisters. They decided to relocate back to [place] from [place] and live together in a house in [place].   Regarding functional status, [place] tells me that her daily life consists of "bathroom to bed to chair".  She has had a drastic decline over the past 2 weeks.  She is not eating or drinking, and has not been able to get out of bed.  We discussed patient's current clinical  status in the context of her underlying metastatic cancer. [place] and her sisters understand she is not a candidate for systemic therapy in her current condition.   I asked [place] about her discussion with Dr. Benay Spice earlier today. She states that the plan is to "keep her comfortable" and that she is ready when "God calls her".   I introduced hospice philosophy, emphasizing the goal is comfort and dignity, rather than prolonging life. I provided information on home versus residential hospice services.  We discussed code status. [place] is quick to state she doesn't want to be "beat on". I confirmed this to mean she does not want aggressive measures such as CPR, defibrillation, or intubation in the event of cardiopulmonary arrest.    Questions and concerns addressed. Patient/family encouraged to call with questions or concerns.     Review of Systems  Constitutional:  Positive for appetite change and fatigue.  Gastrointestinal:  Positive for abdominal pain.    Objective:   Primary Diagnoses: Present on Admission:  Cholangiocarcinoma ([place])  Hypertension  Hyperlipidemia  FTT (failure to thrive) in adult   Physical Exam Vitals reviewed.  Constitutional:      General: She is not in acute distress.    Appearance: She is ill-appearing.     Comments: Frail  Pulmonary:     Effort: Pulmonary effort is normal.  Neurological:  Mental Status: She is alert and oriented to person, place, and time.     Motor: Weakness present.     Vital Signs:  BP (!) 112/54 (BP Location: Left Arm)   Pulse 73   Temp 97.6 F (36.4 C) (Oral)   Resp 16   Ht _0  (1.626 m)   Wt 88.2 kg   SpO2 100%   BMI 33.38 kg/m   Palliative Assessment/Data: PPS 20-30%     Assessment & Plan:   SUMMARY OF RECOMMENDATIONS   Code status changed to DNR/DNI Continue current supportive interventions for now Patient considering home with hospice versus residential hospice facility - needs time to  process PMT will continue to follow  Primary Decision Maker: PATIENT  Symptom Management:  Oxycodone IR 5 mg every 6 hours as needed for pain Marinol 2.5 mg 2 times daily before meals Continue ondansetron 8 mg 3 hours as needed for nausea/vomiting  Prognosis:  Difficult to determine, but may be weeks in the setting of poor PO intake  Discharge Planning:  To Be Determined     Thank you for allowing [place] to participate in the care of [place]   Signed by: Elie Confer, NP Palliative Medicine Team  Team Phone # [phone]  For individual providers, please see AMION

## 2022-09-29 NOTE — Assessment & Plan Note (Addendum)
Metastatic. Patient follows with Dr. Benay Spice. 3 external biliary drains in place. Palliative care consulted. Patient has elected for hospice care at home.

## 2022-09-29 NOTE — Assessment & Plan Note (Addendum)
Crestor held on admission. Can resume on discharge as desired.

## 2022-09-29 NOTE — Progress Notes (Signed)
Peripherally Inserted Central Catheter Placement  The IV Nurse has discussed with the patient and/or persons authorized to consent for the patient, the purpose of this procedure and the potential benefits and risks involved with this procedure.  The benefits include less needle sticks, lab draws from the catheter, and the patient may be discharged home with the catheter. Risks include, but not limited to, infection, bleeding, blood clot (thrombus formation), and puncture of an artery; nerve damage and irregular heartbeat and possibility to perform a PICC exchange if needed/ordered by physician.  Alternatives to this procedure were also discussed.  Bard Power PICC patient education guide, fact sheet on infection prevention and patient information card has been provided to patient /or left at bedside.    PICC Placement Documentation  PICC Single Lumen 09/29/22 Right Brachial 40 cm 1 cm (Active)  Indication for Insertion or Continuance of Line Limited venous access - need for IV therapy >5 days (PICC only) 09/29/22 1825  Exposed Catheter (cm) 1 cm 09/29/22 1825  Site Assessment Clean, Dry, Intact 09/29/22 1825  Line Status Flushed;Blood return noted;Saline locked 09/29/22 1825  Dressing Type Transparent 09/29/22 1825  Dressing Status Antimicrobial disc in place 09/29/22 [place] checked and tightened 09/29/22 1825  Dressing Change Due 09/12/2022 09/29/22 1825       Scotty Court 09/29/2022, 6:43 PM

## 2022-09-30 DIAGNOSIS — E785 Hyperlipidemia, unspecified: Secondary | ICD-10-CM | POA: Diagnosis not present

## 2022-09-30 DIAGNOSIS — R627 Adult failure to thrive: Secondary | ICD-10-CM | POA: Diagnosis not present

## 2022-09-30 DIAGNOSIS — C221 Intrahepatic bile duct carcinoma: Secondary | ICD-10-CM | POA: Diagnosis not present

## 2022-09-30 DIAGNOSIS — N179 Acute kidney failure, unspecified: Secondary | ICD-10-CM | POA: Diagnosis not present

## 2022-09-30 DIAGNOSIS — E669 Obesity, unspecified: Secondary | ICD-10-CM

## 2022-09-30 LAB — DIFFERENTIAL
Abs Immature Granulocytes: 0.53 10*3/uL — ABNORMAL HIGH (ref 0.00–0.07)
Basophils Absolute: 0 10*3/uL (ref 0.0–0.1)
Basophils Relative: 0 %
Eosinophils Absolute: 0 10*3/uL (ref 0.0–0.5)
Eosinophils Relative: 0 %
Immature Granulocytes: 3 %
Lymphocytes Relative: 10 %
Lymphs Abs: 2 10*3/uL (ref 0.7–4.0)
Monocytes Absolute: 1.4 10*3/uL — ABNORMAL HIGH (ref 0.1–1.0)
Monocytes Relative: 7 %
Neutro Abs: 17.3 10*3/uL — ABNORMAL HIGH (ref 1.7–7.7)
Neutrophils Relative %: 80 %

## 2022-09-30 LAB — COMPREHENSIVE METABOLIC PANEL
ALT: 19 U/L (ref 0–44)
AST: 23 U/L (ref 15–41)
Albumin: 1.7 g/dL — ABNORMAL LOW (ref 3.5–5.0)
Alkaline Phosphatase: 437 U/L — ABNORMAL HIGH (ref 38–126)
Anion gap: 8 (ref 5–15)
BUN: 46 mg/dL — ABNORMAL HIGH (ref 8–23)
CO2: 20 mmol/L — ABNORMAL LOW (ref 22–32)
Calcium: 8.2 mg/dL — ABNORMAL LOW (ref 8.9–10.3)
Chloride: 105 mmol/L (ref 98–111)
Creatinine, Ser: 2.13 mg/dL — ABNORMAL HIGH (ref 0.44–1.00)
GFR, Estimated: 23 mL/min — ABNORMAL LOW (ref >60)
Glucose, Bld: 146 mg/dL — ABNORMAL HIGH (ref 70–99)
Potassium: 3.9 mmol/L (ref 3.5–5.1)
Sodium: 133 mmol/L — ABNORMAL LOW (ref 135–145)
Total Bilirubin: 3.9 mg/dL — ABNORMAL HIGH (ref 0.3–1.2)
Total Protein: 7.1 g/dL (ref 6.5–8.1)

## 2022-09-30 LAB — CBC
HCT: 27 % — ABNORMAL LOW (ref 36.0–46.0)
Hemoglobin: 8.8 g/dL — ABNORMAL LOW (ref 12.0–15.0)
MCH: 27.8 pg (ref 26.0–34.0)
MCHC: 32.6 g/dL (ref 30.0–36.0)
MCV: 85.2 fL (ref 80.0–100.0)
Platelets: 309 10*3/uL (ref 150–400)
RBC: 3.17 MIL/uL — ABNORMAL LOW (ref 3.87–5.11)
RDW: 16.6 % — ABNORMAL HIGH (ref 11.5–15.5)
WBC: 22.6 10*3/uL — ABNORMAL HIGH (ref 4.0–10.5)
nRBC: 0 % (ref 0.0–0.2)

## 2022-09-30 NOTE — TOC Transition Note (Signed)
Transition of Care [facility]) - CM/SW Discharge Note   Patient Details  Name: Tavionna Grout MRN: [id number] Date of Birth: 12-10-1940  [facility] CM/SW Contact:  Angelita Ingles, RN Phone Number:[phone]  09/30/2022, 1:00 PM   Clinical Narrative:     Transition of Care (TOC) Screening Note   Patient Details  Name: Ronnell Freshwater Date of Birth: 1941/07/21   Transition of Care [facility]) CM/SW Contact:    Angelita Ingles, RN Phone Number: 09/30/2022, 1:00 PM    Transition of Care Department (TOC) has reviewed patient and no TOC needs have been identified at this time. We will continue to monitor patient advancement through interdisciplinary progression rounds. If new patient transition needs arise, please place a TOC consult.           Patient Goals and CMS Choice        Discharge Placement                       Discharge Plan and Services                                     Social Determinants of Health (SDOH) Interventions     Readmission Risk Interventions     No data to display

## 2022-09-30 NOTE — Progress Notes (Signed)
PT Cancellation Note  Patient Details Name: Barbara Byrd MRN: [id number] DOB: [birth year]   Cancelled Treatment:    Reason Eval/Treat Not Completed: Other (comment) (palliative). Per nursing, family still trying to decide on hospice, will f/u for PT eval once decided if appropriate.    Tori Houston Surges PT, DPT 09/30/22, 1:32 PM

## 2022-09-30 NOTE — Progress Notes (Addendum)
PROGRESS NOTE    Barbara Byrd  KDT:[id number] DOB: [birth year] DOA: 09/28/2022 PCP: Malena Peer, MD   Brief Narrative: Barbara Byrd is a 81 y.o. 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 Patient presented secondary to weakness, weight loss and dehydration. IV fluids initiated. Palliative care consulted for goals of care.   Assessment and Plan: * Acute renal failure superimposed on stage 3a chronic kidney disease (HCC) Baseline creatinine of about 1.2. Creatinine of 2.71 on admission, likely secondary to poor oral intake. Renal ultrasound unremarkable for etiology. Creatinine improving with IV fluids -Continue IV fluids  Obesity (BMI 30-39.9) Estimated body mass index is 35.23 kg/m as calculated from the following:   Height as of this encounter: '5\' 4"'$  (1.626 m).   Weight as of this encounter: 93.1 kg.  Leukocytosis No infectious source. Blood and urine cultures obtained and are pending. Afebrile. No symptoms per patient. No antibiotics initiated at this time. -Follow-up culture data -Differential -Watch for symptoms concerning for infectious etiology  Pressure injury of skin Stage III sacrum, present on admission  FTT (failure to thrive) in adult Likely secondary to metastatic disease. Palliative care consulted.  Hyperlipidemia Crestor held on admission.  Hypertension Patient is on bisoprolol-hydrochlorothiazide and amlodipine as an outpatient which were held secondary to soft blood pressure  Cholangiocarcinoma ([place]) Metastatic. Patient follows with Dr. Benay Spice. 3 external biliary drains in place. Palliative care consulted.    DVT prophylaxis: Lovenox Code Status:   Code Status: DNR Family Communication: None at bedside Disposition Plan: Discharge pending improvement of AKI and continued goals of care  discussions   Consultants:  Medical oncology Palliative care medicine  Procedures:  None  Antimicrobials: None    Subjective: Patient reports no issues this morning. Poor appetite. She reports drinking well, but just no desire to eat.  Objective: BP (!) 142/87 (BP Location: Left Arm)   Pulse 85   Temp 97.8 F (36.6 C) (Oral)   Resp 14   Ht '5\' 4"'$  (1.626 m)   Wt 93.1 kg   SpO2 100%   BMI 35.23 kg/m   Examination:  General exam: Appears calm and comfortable Respiratory system: Clear to auscultation. Respiratory effort normal. Cardiovascular system: S1 & S2 heard, RRR. No murmurs, rubs, gallops or clicks. Gastrointestinal system: Abdomen is distended, soft and non-tender. Normal bowel sounds heard. Central nervous system: Alert and oriented. No focal neurological deficits. Musculoskeletal: No edema. No calf tenderness Skin: No cyanosis. No rashes Psychiatry: Judgement and insight appear normal. Depressed mood with flat affect.   Data Reviewed: I have personally reviewed following labs and imaging studies  CBC Lab Results  Component Value Date   WBC 22.6 (H) 09/30/2022   RBC 3.17 (L) 09/30/2022   HGB 8.8 (L) 09/30/2022   HCT 27.0 (L) 09/30/2022   MCV 85.2 09/30/2022   MCH 27.8 09/30/2022   PLT 309 09/30/2022   MCHC 32.6 09/30/2022   RDW 16.6 (H) 09/30/2022   LYMPHSABS 1.3 07/28/2022   MONOABS 0.8 07/28/2022   EOSABS 0.0 07/28/2022   BASOSABS 0.0 99/83/3825     Last metabolic panel Lab Results  Component Value Date   NA 133 (L) 09/30/2022   K 3.9 09/30/2022   CL 105 09/30/2022   CO2 20 (L) 09/30/2022   BUN 46 (H) 09/30/2022   CREATININE 2.13 (H) 09/30/2022   GLUCOSE 146 (H) 09/30/2022   GFRNONAA 23 (  L) 09/30/2022   CALCIUM 8.2 (L) 09/30/2022   PHOS 3.2 06/04/2022   PROT 7.1 09/30/2022   ALBUMIN 1.7 (L) 09/30/2022   BILITOT 3.9 (H) 09/30/2022   ALKPHOS 437 (H) 09/30/2022   AST 23 09/30/2022   ALT 19 09/30/2022   ANIONGAP 8 09/30/2022     GFR: Estimated Creatinine Clearance: 23.7 mL/min (A) (by C-G formula based on SCr of 2.13 mg/dL (H)).  Recent Results (from the past 240 hour(s))  Culture, blood (Routine X 2) w Reflex to ID Panel     Status: None (Preliminary result)   Collection Time: 09/29/22  3:22 PM   Specimen: BLOOD  Result Value Ref Range Status   Specimen Description   Final    BLOOD BLOOD LEFT HAND Performed at [facility], [place] [address] [place], [place] [address]    Special Requests   Final    BOTTLES DRAWN AEROBIC ONLY Blood Culture results may not be optimal due to an inadequate volume of blood received in culture bottles Performed at [place] [address] [place], [place] [address]    Culture   Final    NO GROWTH < 24 HOURS Performed at [place] [address] [place], [place] [address]    Report Status PENDING  Incomplete  Culture, blood (Routine X 2) w Reflex to ID Panel     Status: None (Preliminary result)   Collection Time: 09/29/22  6:25 PM   Specimen: BLOOD  Result Value Ref Range Status   Specimen Description   Final    BLOOD PICC LINE Performed at [facility], [place] [address], [place], [place] [address]    Special Requests   Final    BOTTLES DRAWN AEROBIC AND ANAEROBIC Blood Culture adequate volume Performed at [place] [address], [place], [place] [address]    Culture   Final    NO GROWTH < 12 HOURS Performed at [place] [address] [place], [place] [address]    Report Status PENDING  Incomplete      Radiology Studies: [place] EKG SITE RITE  Result Date: 09/29/2022 If Site Rite image not attached, placement could not be confirmed due to current cardiac rhythm.  US RENAL  Result Date: 09/29/2022 CLINICAL DATA:  Renal failure EXAM: RENAL / URINARY TRACT ULTRASOUND COMPLETE COMPARISON:  None Available. FINDINGS: Right Kidney: Renal measurements: 8.6 x 4.1 x  5.8 cm = volume: 106 mL. Echogenicity within normal limits. No mass or hydronephrosis visualized. Left Kidney: Renal measurements: 10.5 x 6.4 x 4.9 cm = volume: 173 mL. Echogenicity within normal limits. No mass or hydronephrosis visualized. Bladder: Appears normal for degree of bladder distention. Other: None. IMPRESSION: No acute ultrasound findings. No hydronephrosis. Electronically Signed   By: Delanna Ahmadi M.D.   On: 09/29/2022 13:59      LOS: 2 days    Cordelia Poche, MD [facility] 09/30/2022, 12:00 PM   If 7PM-7AM, please contact night-coverage [email or web address]

## 2022-09-30 NOTE — Progress Notes (Signed)
IP PROGRESS NOTE  Subjective:   Barbara Byrd denies pain and nausea.  She feels better.  Objective: Vital signs in last 24 hours: Blood pressure 98/65, pulse 87, temperature 97.7 F (36.5 C), temperature source Oral, resp. rate 20, height _0  (1.626 m), weight 205 lb 4 oz (93.1 kg), SpO2 100 %.  Intake/Output from previous day: 10/24 0701 - 10/25 0700 In: 3194.8 [P.O.:572; I.V.:2622.8] Out: 1000 [Urine:750; Drains:250]  Physical Exam:  HEENT: Scleral icterus Abdomen: Left and right upper abdominal biliary drains, the abdomen is soft Extremities: No leg edema   Lab Results: Recent Labs    09/29/22 0529 09/30/22 0500  WBC 20.8* 22.6*  HGB 9.5* 8.8*  HCT 29.2* 27.0*  PLT 335 309    BMET Recent Labs    09/29/22 0529 09/30/22 0500  NA 131* 133*  K 4.4 3.9  CL 102 105  CO2 19* 20*  GLUCOSE 103* 146*  BUN 56* 46*  CREATININE 2.71* 2.13*  CALCIUM 8.4* 8.2*    Lab Results  Component Value Date   CAN199 935 (H) 04/20/2022    Studies/Results: [place] EKG SITE RITE  Result Date: 09/29/2022 If Site Rite image not attached, placement could not be confirmed due to current cardiac rhythm.  US RENAL  Result Date: 09/29/2022 CLINICAL DATA:  Renal failure EXAM: RENAL / URINARY TRACT ULTRASOUND COMPLETE COMPARISON:  None Available. FINDINGS: Right Kidney: Renal measurements: 8.6 x 4.1 x 5.8 cm = volume: 106 mL. Echogenicity within normal limits. No mass or hydronephrosis visualized. Left Kidney: Renal measurements: 10.5 x 6.4 x 4.9 cm = volume: 173 mL. Echogenicity within normal limits. No mass or hydronephrosis visualized. Bladder: Appears normal for degree of bladder distention. Other: None. IMPRESSION: No acute ultrasound findings. No hydronephrosis. Electronically Signed   By: Delanna Ahmadi M.D.   On: 09/29/2022 13:59    Medications: I have reviewed the patient's current medications.  Assessment/Plan: Common bile duct cholangiocarcinoma-stage IIb,pT2pN1 Presenting with  obstructive jaundice summer 2019 Elevated preoperative CA 19-9 ERCP/EUS 09/02/2018-stenosis and lower third of the main bile duct, mass in the uncinate of the pancreas,-biopsy revealed minute focus of atypical cells Pancreaticoduodenectomy 11/15/2018,pT2pN1 moderately differentiated adenocarcinoma of the common bile duct, 2/35 lymph nodes, perineural invasion Patient reports 6 months of adjuvant gemcitabine/capecitabine Seen with complaint of abdominal pain 02/23/2022, CA 19-9 elevated, referred for CTs CTs 02/27/2022 status post Whipple without specific evidence of recurrent or metastatic disease.  Tiny pulmonary nodules not readily apparent on the outside CT of 07/27/2019.  No thoracic adenopathy. Enlarged heterogeneous left lobe of the thyroid grossly similar to the prior exam. PET 3/97/6734-LPFXTKWIOX hypermetabolic liver lesions consistent with metastases, mild uptake involving a right common iliac lymph node MRI abdomen 05/17/2022-lesions in the right liver noted on PET are not well defined on MRI, left and right intrahepatic biliary dilatation, suggesting obstruction at the confluence of the left and right hepatic ducts, hypoenhancing lesion at this level measuring 10 x 12 mm.  Central hypermetabolic activity in this region on the PET, no evidence of nodal disease 06/02/2022 cholangiogram and placement of a biliary drain by interventional radiology-central intrahepatic biliary dilatation but minimal peripheral biliary dilatation; right hepatic lobe demonstrated irregular dilated central bile ducts with drainage through the surgical anastomosis into the small bowel; narrowing at the surgical anastomosis; dilated left hepatic bile duct demonstrated filling defects within the bile duct; contrast eventually drained into the small bowel through the hepaticojejunostomy.  Cytology on the biliary tract, bile, biliary drain suspicious for malignancy. 06/15/2022-cholangiogram confirmed  central left biliary stenosis,  biliary brush biopsy-adenocarcinoma, left biliary drain exchanged and upsized  CT abdomen/pelvis 06/27/2022-numerous small pulmonary nodules in the included bilateral lung bases which are new and enlarged compared to exam dated 02/27/2022.  Interval placement of left-sided percutaneous biliary drain.  Similar intrahepatic biliary ductal dilatation when compared to prior MRI most notable left lobe of the liver. 07/02/2022 biliary drain exchanged on the left and new drain placed on the right 07/28/2022 new right biliary internal/external drain placed; exchange of prior left and right internal/external biliary drain. 08/26/2022-right inferior biliary drain placed back to bag drainage.  Right superior and left biliary drains patent, capped. 09/15/2022-exchange and upsize of displaced left internal/external biliary drain and routine exchange of 2 right-sided internal/external biliary drains 2.  Hypertension 3.  Hyperlipidemia 4.  COVID-19 2021 5.  06/02/2022 admitted with rigors, bradycardia, hypotension, nausea/vomiting following placement of the biliary drain, likely acute cholangitis, blood culture positive for E. coli, discharged home on levofloxacin 6.  Admission 09/28/2022 with nausea, failure to thrive, and dehydration   Barbara Byrd appears improved.  She reports tolerating a diet.  The creatinine is improved today.  Cultures remain negative. Her performance status remains poor.  She has metastatic cholangiocarcinoma.  She does not appear to be a candidate for chemotherapy.  We will submit p tissue for mismatch repair protein and MSI testing to see whether she is a candidate for immunotherapy, but this is unlikely.  She appears to be a candidate for home hospice care.  She agrees to a hospice referral.    Recommendations: 1.  Continue intravenous hydration, 2.  Follow-up cultures 3.  hospice referral for home hospice care        LOS: 2 days   [name], MD   09/30/2022, 2:08 PM

## 2022-10-01 ENCOUNTER — Inpatient Hospital Stay: Payer: Medicare HMO

## 2022-10-01 ENCOUNTER — Inpatient Hospital Stay: Payer: Medicare HMO | Admitting: Oncology

## 2022-10-01 ENCOUNTER — Encounter: Payer: Self-pay | Admitting: *Deleted

## 2022-10-01 DIAGNOSIS — N179 Acute kidney failure, unspecified: Secondary | ICD-10-CM | POA: Diagnosis not present

## 2022-10-01 DIAGNOSIS — E871 Hypo-osmolality and hyponatremia: Secondary | ICD-10-CM

## 2022-10-01 DIAGNOSIS — R627 Adult failure to thrive: Secondary | ICD-10-CM | POA: Diagnosis not present

## 2022-10-01 DIAGNOSIS — E785 Hyperlipidemia, unspecified: Secondary | ICD-10-CM | POA: Diagnosis not present

## 2022-10-01 DIAGNOSIS — C221 Intrahepatic bile duct carcinoma: Secondary | ICD-10-CM | POA: Diagnosis not present

## 2022-10-01 LAB — URINE CULTURE: Culture: <10000 — AB

## 2022-10-01 NOTE — Progress Notes (Signed)
Pts sisters are at bedside and sister [name] stated she forgot her phone in the car and that is the reason she did not return her call. I have added sister Lou's phone number to the contacts. She stated she always has her phone on her.

## 2022-10-01 NOTE — Assessment & Plan Note (Addendum)
Mild. Stable. ?

## 2022-10-01 NOTE — Progress Notes (Signed)
PT Cancellation Note  Patient Details Name: Barbara Byrd MRN: [id number] DOB: [birth year]   Cancelled Treatment:    Reason Eval/Treat Not Completed: PT screened, no needs identified, will sign off Acknowledging PT evaluation order has been discontinued.  Current plan is for home with hospice.   Myrtis Hopping [name] 10/01/2022, 11:48 AM Arlyce Dice, DPT Physical Therapist Acute Rehabilitation Services Preferred contact method: Secure Chat Weekend Pager Only: [phone] Office: [phone]

## 2022-10-01 NOTE — Progress Notes (Signed)
IHC testing requested from Accession number [id number] Bile Duct Brushing

## 2022-10-01 NOTE — TOC Initial Note (Signed)
Transition of Care [facility]) - Initial/Assessment Note    Patient Details  Name: Barbara Byrd MRN: [id number] Date of Birth: 16-Apr-1941  Transition of Care [facility]) CM/SW Contact:    Henrietta Dine, RN Phone Number: 10/01/2022, 10:29 AM  Clinical Narrative:                 [facility] consult for home hospice; pt from home with family; her plan is to return with home hospice; provided pt and sisters with list of home hospice agencies; the pt wears glasses; she also has a bed, wheelchair, walker and BSC from [employer]; her sister says the equipment was arranged through Dr. Gearldine Shown office; contacted Raquel James with [facility] [phone]) and she says the family has contacted the main office; they will proceed with referral; TOC will con't to follow.  Expected Discharge Plan: Home w Hospice Care Barriers to Discharge: Continued Medical Work up   Patient Goals and CMS Choice Patient states their goals for this hospitalization and ongoing recovery are:: home with hospice      Expected Discharge Plan and Services Expected Discharge Plan: [place]   Discharge Planning Services: CM Consult   Living arrangements for the past 2 months: Single Family Home                                      Prior Living Arrangements/Services Living arrangements for the past 2 months: Single Family Home Lives with:: Self, Relatives Patient language and need for interpreter reviewed:: Yes Do you feel safe going back to the place where you live?: Yes      Need for Family Participation in Patient Care: Yes (Comment) Care giver support system in place?: Yes (comment) Current home services: DME ([employer]: bsc, bed, walker, and wheelchair) Criminal Activity/Legal Involvement Pertinent to Current Situation/Hospitalization: No - Comment as needed  Activities of Daily Living Home Assistive Devices/Equipment: Walker (specify type) ADL Screening (condition at time of admission) Patient's  cognitive ability adequate to safely complete daily activities?: Yes Is the patient deaf or have difficulty hearing?: No Does the patient have difficulty seeing, even when wearing glasses/contacts?: No Does the patient have difficulty concentrating, remembering, or making decisions?: No Patient able to express need for assistance with ADLs?: Yes Does the patient have difficulty dressing or bathing?: Yes Independently performs ADLs?: No Communication: Independent Dressing (OT): Needs assistance Is this a change from baseline?: Pre-admission baseline Grooming: Needs assistance Is this a change from baseline?: Pre-admission baseline Feeding: Independent Bathing: Needs assistance Is this a change from baseline?: Pre-admission baseline Toileting: Needs assistance Is this a change from baseline?: Pre-admission baseline In/Out Bed: Needs assistance Is this a change from baseline?: Pre-admission baseline Walks in Home: Needs assistance Is this a change from baseline?: Pre-admission baseline Does the patient have difficulty walking or climbing stairs?: Yes Weakness of Legs: Both Weakness of Arms/Hands: None  Permission Sought/Granted Permission sought to share information with : Case Manager Permission granted to share information with : Yes, Verbal Permission Granted  Share Information with NAME: Lenor Coffin, CM, RN           Emotional Assessment Appearance:: Appears stated age Attitude/Demeanor/Rapport: Gracious Affect (typically observed): Accepting Orientation: : Oriented to Self, Oriented to Place, Oriented to  Time, Oriented to Situation Alcohol / Substance Use: Not Applicable Psych Involvement: No (comment)  Admission diagnosis:  FTT (failure to thrive) in adult [R62.7] Patient Active Problem List  Diagnosis Date Noted   Acute renal failure superimposed on stage 3a chronic kidney disease ([place]) 09/29/2022   Leukocytosis 09/29/2022   Obesity (BMI 30-39.9) 09/29/2022    FTT (failure to thrive) in adult 09/28/2022   Pressure injury of skin 09/28/2022   Admission for biliary drainage tube placement 07/28/2022   Hyperbilirubinemia 07/28/2022   SVT (supraventricular tachycardia) 06/05/2022   E coli bacteremia 06/04/2022   Biliary obstruction 06/02/2022   Hypertension 06/02/2022   Hyperlipidemia 06/02/2022   Septic shock ([place]) 06/02/2022   Genetic testing 09/25/2021   Family history of prostate cancer 09/11/2021   Family history of cancer of pituitary gland and craniopharyngeal duct 09/11/2021   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 [place] [place] [address] Phone: [phone] Fax: [place] [address] Phone: [phone] Fax: [phone]     Social Determinants of Health (SDOH) Interventions    Readmission Risk Interventions     No data to display

## 2022-10-01 NOTE — Progress Notes (Signed)
Manufacturing engineer [facility]) Hospital Liaison Note   Received request from [facility] [place], [facility], for hospice services at home after discharge.   Unfortunately, MSW unable to make contact with family (sister/[name]: [phone]) via telephone call or bedside visit. VM left requesting call bed returned to MSW at family's earliest convenience.    [facility] information and contact numbers given to family & above information shared with TOC.   Please call with any questions/concerns.    Thank you for the opportunity to participate in this patient's care.   Daphene Calamity, MSW [facility] Liaison  [phone]

## 2022-10-01 NOTE — Progress Notes (Addendum)
PROGRESS NOTE    Osino  EUM:[id number] DOB: Nov 05, 1941 DOA: 09/28/2022 PCP: Malena Peer, MD   Brief Narrative: Barbara Byrd is a 81 y.o. 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 Patient presented secondary to weakness, weight loss and dehydration. IV fluids initiated. Palliative care consulted for goals of care. Patient has elected for home with hospice care.   Assessment and Plan: * Acute renal failure superimposed on stage 3a chronic kidney disease (HCC) Baseline creatinine of about 1.2. Creatinine of 2.71 on admission, likely secondary to poor oral intake. Renal ultrasound unremarkable for etiology. Creatinine improving with IV fluids -Continue IV fluids  Hyponatremia Mild.  Obesity (BMI 30-39.9) Estimated body mass index is 35.23 kg/m as calculated from the following:   Height as of this encounter: '5\' 4"'$  (1.626 m).   Weight as of this encounter: 93.1 kg.  Leukocytosis No infectious source. Blood and urine cultures obtained and are pending. Afebrile. No symptoms per patient. No antibiotics initiated at this time.  Pressure injury of skin Stage III sacrum, present on admission  FTT (failure to thrive) in adult Likely secondary to metastatic disease. Palliative care consulted.  Hyperlipidemia Crestor held on admission.  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 Patient follows with Dr. Benay Spice. 3 external biliary drains in place. Palliative care consulted.    DVT prophylaxis: Lovenox Code Status:   Code Status: DNR Family Communication: None at bedside Disposition Plan: Discharge pending hospice availability at home. Patient has elected home with  hospice.   Consultants:  Medical oncology Palliative care medicine  Procedures:  None  Antimicrobials: None    Subjective: Patient reports no issues this morning.  Objective: BP 99/62 (BP Location: Left Arm)   Pulse 98   Temp 97.9 F (36.6 C) (Oral)   Resp 20   Ht '5\' 4"'$  (1.626 m)   Wt 91.1 kg   SpO2 99%   BMI 34.47 kg/m   Examination:  General exam: Appears calm and comfortable Respiratory system: Clear to auscultation. Respiratory effort normal. Cardiovascular system: S1 & S2 heard, RRR. Gastrointestinal system: Abdomen is nondistended, soft and nontender. Normal bowel sounds heard. Central nervous system: Alert and oriented. Musculoskeletal: No edema. No calf tenderness Skin: No cyanosis. No rashes Psychiatry: Flat affect. Depressed mood.   Data Reviewed: I have personally reviewed following labs and imaging studies  CBC Lab Results  Component Value Date   WBC 22.6 (H) 09/30/2022   RBC 3.17 (L) 09/30/2022   HGB 8.8 (L) 09/30/2022   HCT 27.0 (L) 09/30/2022   MCV 85.2 09/30/2022   MCH 27.8 09/30/2022   PLT 309 09/30/2022   MCHC 32.6 09/30/2022   RDW 16.6 (H) 09/30/2022   LYMPHSABS 2.0 09/30/2022   MONOABS 1.4 (H) 09/30/2022   EOSABS 0.0 09/30/2022   BASOSABS 0.0 54/00/8676     Last metabolic panel Lab Results  Component Value Date   NA 133 (L) 09/30/2022   K 3.9 09/30/2022   CL 105 09/30/2022   CO2 20 (L) 09/30/2022   BUN 46 (H) 09/30/2022   CREATININE 2.13 (H) 09/30/2022   GLUCOSE 146 (H) 09/30/2022   GFRNONAA 23 (L) 09/30/2022   CALCIUM 8.2 (L) 09/30/2022   PHOS 3.2 06/04/2022   PROT 7.1 09/30/2022   ALBUMIN 1.7 (L) 09/30/2022   BILITOT  3.9 (H) 09/30/2022   ALKPHOS 437 (H) 09/30/2022   AST 23 09/30/2022   ALT 19 09/30/2022   ANIONGAP 8 09/30/2022    GFR: Estimated Creatinine Clearance: 23.4 mL/min (A) (by C-G formula based on SCr of 2.13 mg/dL (H)).  Recent Results (from the past 240 hour(s))  Urine Culture     Status: Abnormal    Collection Time: 09/29/22  8:11 AM   Specimen: Urine, Clean Catch  Result Value Ref Range Status   Specimen Description   Final    URINE, CLEAN CATCH Performed at [facility], [place] [address] [place], [place] [address]    Special Requests   Final    NONE Performed at [facility], [place] [address], [place], [place] [address]    Culture (A)  Final    <10,000 COLONIES/mL INSIGNIFICANT GROWTH Performed at [facility] [address] [place], [place] [address]    Report Status 10/01/2022 FINAL  Final  Culture, blood (Routine X 2) w Reflex to ID Panel     Status: None (Preliminary result)   Collection Time: 09/29/22  3:22 PM   Specimen: BLOOD  Result Value Ref Range Status   Specimen Description   Final    BLOOD BLOOD LEFT HAND Performed at [facility] [address], [place], [place] [address]    Special Requests   Final    BOTTLES DRAWN AEROBIC ONLY Blood Culture results may not be optimal due to an inadequate volume of blood received in culture bottles Performed at [facility] [address] [place], [place] [address]    Culture   Final    NO GROWTH 2 DAYS Performed at [facility] [address], [place], [place] [address]    Report Status PENDING  Incomplete  Culture, blood (Routine X 2) w Reflex to ID Panel     Status: None (Preliminary result)   Collection Time: 09/29/22  6:25 PM   Specimen: BLOOD  Result Value Ref Range Status   Specimen Description   Final    BLOOD PICC LINE Performed at [facility], [place] [address], [place], [place] [address]    Special Requests   Final    BOTTLES DRAWN AEROBIC AND ANAEROBIC Blood Culture adequate volume Performed at [facility] [address] [place], [place] [address]    Culture   Final    NO GROWTH 2 DAYS Performed at [facility] [address], [place], [place] [address]    Report  Status PENDING  Incomplete      Radiology Studies: No results found.    LOS: 3 days    Cordelia Poche, MD [facility] 10/01/2022, 2:55 PM   If 7PM-7AM, please contact night-coverage [email or web address]

## 2022-10-01 NOTE — Care Management Important Message (Signed)
Important Message  Patient Details IM Letter given Name: Barbara Byrd MRN: [id number] Date of Birth: 1940-12-24   Medicare Important Message Given:  Yes     Kerin Salen 10/01/2022, 9:22 AM

## 2022-10-02 ENCOUNTER — Encounter: Payer: Self-pay | Admitting: *Deleted

## 2022-10-02 DIAGNOSIS — E785 Hyperlipidemia, unspecified: Secondary | ICD-10-CM | POA: Diagnosis not present

## 2022-10-02 DIAGNOSIS — N179 Acute kidney failure, unspecified: Secondary | ICD-10-CM | POA: Diagnosis not present

## 2022-10-02 DIAGNOSIS — Z515 Encounter for palliative care: Secondary | ICD-10-CM

## 2022-10-02 DIAGNOSIS — Z66 Do not resuscitate: Secondary | ICD-10-CM

## 2022-10-02 DIAGNOSIS — C221 Intrahepatic bile duct carcinoma: Secondary | ICD-10-CM | POA: Diagnosis not present

## 2022-10-02 DIAGNOSIS — R627 Adult failure to thrive: Secondary | ICD-10-CM | POA: Diagnosis not present

## 2022-10-02 DIAGNOSIS — I959 Hypotension, unspecified: Secondary | ICD-10-CM

## 2022-10-02 DIAGNOSIS — E871 Hypo-osmolality and hyponatremia: Secondary | ICD-10-CM

## 2022-10-02 MED ORDER — MIDODRINE HCL 5 MG PO TABS
10.0000 mg | ORAL_TABLET | Freq: Three times a day (TID) | ORAL | Status: DC
  Administered 2022-10-02: 10 mg via ORAL
  Filled 2022-10-02: qty 2

## 2022-10-02 MED ORDER — MIDODRINE HCL 10 MG PO TABS: 10.0000 mg | ORAL_TABLET | Freq: Three times a day (TID) | ORAL | 0 refills | Status: AC

## 2022-10-02 MED ORDER — SODIUM CHLORIDE 0.9 % IV BOLUS
500.0000 mL | Freq: Once | INTRAVENOUS | Status: AC
  Administered 2022-10-02: 500 mL via INTRAVENOUS

## 2022-10-02 MED ORDER — OXYCODONE HCL 5 MG PO TABS: 2.5000 mg | ORAL_TABLET | ORAL | 0 refills | Status: AC | PRN

## 2022-10-02 NOTE — Progress Notes (Signed)
[facility] [address] [employer] [facility] Hospital Liaison Note   Received request from [facility], [name], RN, for hospice services at home after discharge.    Spoke with patient's sisters, Barbara Byrd and Barbara Byrd to initiate education related to hospice philosophy, services, and team approach to care. Patient's sisters verbalized understanding of information given. Per discussion, the plan is for patient to discharge home via private car once cleared to DC.    DME needs discussed. Patient has the following equipment in the home (Purchased privately): Hospital bed, Wheelchair, beside commode, and overbed table  Patient requests the following equipment for delivery: None  Address verified and is correct in the chart.   Please send signed and completed DNR home with patient/family. Please provide prescriptions at discharge as needed to ensure ongoing symptom management.    [facility] information and contact numbers given to family & above information shared with TOC.   Please call with any questions/concerns.    Thank you for the opportunity to participate in this patient's care.   [name]  [facility] Liaison  [phone]

## 2022-10-02 NOTE — Progress Notes (Signed)
Spoke to triage nurse from [facility] [place] regarding PICC line. She states that PICC should be left in upon discharge.

## 2022-10-02 NOTE — Discharge Instructions (Signed)
[facility] will be discharging home with hospice. Your blood pressure was a little low, so I am prescribing a medication to help keep it elevated. I have also changed your pain medication because of your kidney function. Please follow-up with your oncologist as previously agreed upon.

## 2022-10-02 NOTE — Progress Notes (Signed)
   10/02/22 1020  Assess: MEWS Score  BP (!) 80/49  Pulse Rate 86  Level of Consciousness Alert  Assess: MEWS Score  MEWS Temp 0  MEWS Systolic 2  MEWS Pulse 0  MEWS RR 0  MEWS LOC 0  MEWS Score 2  MEWS Score Color Yellow  Assess: if the MEWS score is Yellow or Red  Were vital signs taken at a resting state? Yes  Focused Assessment No change from prior assessment  Does the patient meet 2 or more of the SIRS criteria? No  Does the patient have a confirmed or suspected source of infection? No  MEWS guidelines implemented *See Row Information* No, vital signs rechecked  Treat  Pain Scale 0-10  Pain Score 0  Notify: Provider  Provider Name/Title [facility]  Date Provider Notified 10/02/22  Time Provider Notified 1020  Method of Notification Page  Notification Reason Critical result  Provider response No new orders  Date of Provider Response 10/02/22  Time of Provider Response 1020  Document  Patient Outcome Stabilized after interventions  Assess: SIRS CRITERIA  SIRS Temperature  0  SIRS Pulse 0  SIRS Respirations  0  SIRS WBC 1  SIRS Score Sum  1   Pt plans to go Home with Hospice today.

## 2022-10-02 NOTE — Discharge Summary (Signed)
Physician Discharge Summary   Patient: Barbara Byrd MRN: [id number] DOB: 04-14-1941  Admit date:     09/28/2022  Discharge date: 10/02/22  Discharge Physician: Cordelia Poche   PCP: Malena Peer, MD   Recommendations at discharge:  Home with hospice.  Discharge Diagnoses: Principal Problem:   Acute renal failure superimposed on stage 3a chronic kidney disease (HCC) Active Problems:   Cholangiocarcinoma (HCC)   Hypertension   Hyperlipidemia   FTT (failure to thrive) in adult   Pressure injury of skin   Leukocytosis   Obesity (BMI 30-39.9)   Hyponatremia   Hospice care patient  Resolved Problems:   * No resolved hospital problems. [facility] Course: Barbara Byrd is a 81 y.o. 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 Patient presented secondary to weakness, weight loss and dehydration. IV fluids initiated. Palliative care consulted for goals of care. Patient has elected for home with hospice care.  Assessment and Plan: * Acute renal failure superimposed on stage 3a chronic kidney disease (HCC) Baseline creatinine of about 1.2. Creatinine of 2.71 on admission, likely secondary to poor oral intake. Renal ultrasound unremarkable for etiology. Creatinine improved with IV fluids. Now hospice care.  Hypotension Discharge with midodrine 10 mg TID.  Hyponatremia Mild. Stable.  Obesity (BMI 30-39.9) Estimated body mass index is 36.25 kg/m as calculated from the following:   Height as of this encounter: _0  (1.626 m).   Weight as of this encounter: 95.8 kg.  Leukocytosis No infectious source. Blood and urine cultures obtained and are pending. Afebrile. No symptoms per patient. No antibiotics initiated. Patient has now elected for hospice care. No further workup.  Pressure injury of skin Stage III sacrum, present on  admission  FTT (failure to thrive) in adult Likely secondary to metastatic disease. Palliative care consulted and patient has elected for hospice care at home.  Hyperlipidemia Crestor held on admission. Can resume on discharge as desired.  Hypertension Patient is on bisoprolol-hydrochlorothiazide and amlodipine as an outpatient which were held secondary to soft blood pressure. Discontinue on discharge secondary to hypotension.  Cholangiocarcinoma ([place]) Metastatic. Patient follows with Dr. Benay Spice. 3 external biliary drains in place. Palliative care consulted. Patient has elected for hospice care at home.   Consultants: Hematology/oncology, palliative care medicine Procedures performed: None  Disposition: Hospice care Diet recommendation: Regular diet  DISCHARGE MEDICATION: Allergies as of 10/02/2022   No Known Allergies      Medication List     STOP taking these medications    amLODipine 10 MG tablet Commonly known as: NORVASC   bisoprolol-hydrochlorothiazide 5-6.25 MG tablet Commonly known as: ZIAC   Normal Saline Flush 0.9 % Soln   potassium chloride 10 MEQ tablet Commonly known as: KLOR-CON   traMADol 50 MG tablet Commonly known as: ULTRAM       TAKE these medications    midodrine 10 MG tablet Commonly known as: PROAMATINE Take 1 tablet (10 mg total) by mouth 3 (three) times daily with meals. Start taking on: October 03, 2022   oxyCODONE 5 MG immediate release tablet Commonly known as: Roxicodone Take 0.5-1 tablets (2.5-5 mg total) by mouth every 4 (four) hours as needed for moderate pain or severe pain.   rosuvastatin 5 MG tablet Commonly known as: CRESTOR Take 5 mg by mouth daily.   trolamine salicylate 10 % cream Commonly known as: ASPERCREME Apply 1 Application topically  2 (two) times daily as needed for muscle pain.        Discharge Exam: BP 90/61 (BP Location: Left Wrist)   Pulse 77   Temp 97.7 F (36.5 C) (Oral)   Resp 20   Ht 5'  4" (1.626 m)   Wt 95.8 kg   SpO2 100%   BMI 36.25 kg/m   General exam: Appears calm and comfortable Respiratory system:  Respiratory effort normal. Cardiovascular system: S1 & S2 heard, RRR. Gastrointestinal system: Abdomen is nondistended   Condition at discharge:  Comfort care/Hospice  The results of significant diagnostics from this hospitalization (including imaging, microbiology, ancillary and laboratory) are listed below for reference.   Imaging Studies: [place] EKG SITE RITE  Result Date: 09/29/2022 If Site Rite image not attached, placement could not be confirmed due to current cardiac rhythm.  US RENAL  Result Date: 09/29/2022 CLINICAL DATA:  Renal failure EXAM: RENAL / URINARY TRACT ULTRASOUND COMPLETE COMPARISON:  None Available. FINDINGS: Right Kidney: Renal measurements: 8.6 x 4.1 x 5.8 cm = volume: 106 mL. Echogenicity within normal limits. No mass or hydronephrosis visualized. Left Kidney: Renal measurements: 10.5 x 6.4 x 4.9 cm = volume: 173 mL. Echogenicity within normal limits. No mass or hydronephrosis visualized. Bladder: Appears normal for degree of bladder distention. Other: None. IMPRESSION: No acute ultrasound findings. No hydronephrosis. Electronically Signed   By: Delanna Ahmadi M.D.   On: 09/29/2022 13:59   IR EXCHANGE BILIARY DRAIN  Result Date: 09/16/2022 INDICATION: 81 year old female with a history of biliary obstruction, and complaint of drainage at site of the PTC drains. EXAM: IMAGE GUIDED INJECTION AND EXCHANGE OF 3 INTERNAL/EXTERNAL BILIARY DRAINS MEDICATIONS: None ANESTHESIA/SEDATION: Moderate (conscious) sedation was not employed during this procedure. FLUOROSCOPY: Radiation Exposure Index (as provided by the fluoroscopic device): 10 mGy Kerma COMPLICATIONS: None PROCEDURE: Informed written consent was obtained from the patient after a thorough discussion of the procedural risks, benefits and alternatives. All questions were addressed. Maximal Sterile  Barrier Technique was utilized including caps, mask, sterile gowns, sterile gloves, sterile drape, hand hygiene and skin antiseptic. A timeout was performed prior to the initiation of the procedure. Patient was positioned supine on the table under the image intensifier. Scout images were acquired confirming that the left-sided drain has been withdrawn. Given the murky output from all 3 drains, we elected to change all 3 biliary drains. All 3 of these biliary drains currently are 12 French internal/external. Our current stock in the department only allows upsize of the left and the right biliary drains to 14 [place], and a down size of the right inferior drain to 10 [place]. The patient was prepped and draped in the usual sterile fashion. 1% lidocaine was used for local anesthesia. Contrast was injected through all 3 drains. We first addressed the displaced left-sided drain. Catheter was amputated and 035 wire was advanced through the drain, which was removed. Angled Kumpe catheter was then used to manipulate the wire into the duodenum. Wire was removed and contrast was injected confirming location. A standard modified Seldinger technique was used to then place a 14 [place] biliary drain via the left-sided access. Contrast injected confirmed location. We then addressed the superior right internal external drain. Catheter was injected confirming location. Catheter was amputated and modified Seldinger technique was used to place a new 30 [place] internal external biliary drain. We then address the inferior right-sided drain. Contrast was injected confirming location. Catheter was amputated and we exchanged for a new 10 [place] internal/external biliary drain.  Contrast confirmed location. All 3 drains were sutured in position and a final image was stored. IMPRESSION: Status post exchange and up size of displaced left internal/external biliary drain, and routine exchange of 2 right-sided internal/external biliary drains as  above. Signed, Dulcy Fanny. Nadene Rubins, RPVI [facility] Radiology Electronically Signed   By: Corrie Mckusick D.O.   On: 09/16/2022 09:05   IR EXCHANGE BILIARY DRAIN  Result Date: 09/16/2022 INDICATION: 81 year old female with a history of biliary obstruction, and complaint of drainage at site of the PTC drains. EXAM: IMAGE GUIDED INJECTION AND EXCHANGE OF 3 INTERNAL/EXTERNAL BILIARY DRAINS MEDICATIONS: None ANESTHESIA/SEDATION: Moderate (conscious) sedation was not employed during this procedure. FLUOROSCOPY: Radiation Exposure Index (as provided by the fluoroscopic device): 10 mGy Kerma COMPLICATIONS: None PROCEDURE: Informed written consent was obtained from the patient after a thorough discussion of the procedural risks, benefits and alternatives. All questions were addressed. Maximal Sterile Barrier Technique was utilized including caps, mask, sterile gowns, sterile gloves, sterile drape, hand hygiene and skin antiseptic. A timeout was performed prior to the initiation of the procedure. Patient was positioned supine on the table under the image intensifier. Scout images were acquired confirming that the left-sided drain has been withdrawn. Given the murky output from all 3 drains, we elected to change all 3 biliary drains. All 3 of these biliary drains currently are 12 French internal/external. Our current stock in the department only allows upsize of the left and the right biliary drains to 14 [place], and a down size of the right inferior drain to 10 [place]. The patient was prepped and draped in the usual sterile fashion. 1% lidocaine was used for local anesthesia. Contrast was injected through all 3 drains. We first addressed the displaced left-sided drain. Catheter was amputated and 035 wire was advanced through the drain, which was removed. Angled Kumpe catheter was then used to manipulate the wire into the duodenum. Wire was removed and contrast was  injected confirming location. A standard modified Seldinger technique was used to then place a 14 [place] biliary drain via the left-sided access. Contrast injected confirmed location. We then addressed the superior right internal external drain. Catheter was injected confirming location. Catheter was amputated and modified Seldinger technique was used to place a new 18 [place] internal external biliary drain. We then address the inferior right-sided drain. Contrast was injected confirming location. Catheter was amputated and we exchanged for a new 10 [place] internal/external biliary drain. Contrast confirmed location. All 3 drains were sutured in position and a final image was stored. IMPRESSION: Status post exchange and up size of displaced left internal/external biliary drain, and routine exchange of 2 right-sided internal/external biliary drains as above. Signed, Dulcy Fanny. Nadene Rubins, RPVI [facility] Radiology Electronically Signed   By: Corrie Mckusick D.O.   On: 09/16/2022 09:05   IR EXCHANGE BILIARY DRAIN  Result Date: 09/16/2022 INDICATION: 81 year old female with a history of biliary obstruction, and complaint of drainage at site of the PTC drains. EXAM: IMAGE GUIDED INJECTION AND EXCHANGE OF 3 INTERNAL/EXTERNAL BILIARY DRAINS MEDICATIONS: None ANESTHESIA/SEDATION: Moderate (conscious) sedation was not employed during this procedure. FLUOROSCOPY: Radiation Exposure Index (as provided by the fluoroscopic device): 10 mGy Kerma COMPLICATIONS: None PROCEDURE: Informed written consent was obtained from the patient after a thorough discussion of the procedural risks, benefits and alternatives. All questions were addressed. Maximal Sterile Barrier Technique was utilized including caps, mask, sterile gowns, sterile gloves, sterile drape, hand hygiene  and skin antiseptic. A timeout was performed prior to the initiation of the procedure. Patient was positioned  supine on the table under the image intensifier. Scout images were acquired confirming that the left-sided drain has been withdrawn. Given the murky output from all 3 drains, we elected to change all 3 biliary drains. All 3 of these biliary drains currently are 12 French internal/external. Our current stock in the department only allows upsize of the left and the right biliary drains to 14 [place], and a down size of the right inferior drain to 10 [place]. The patient was prepped and draped in the usual sterile fashion. 1% lidocaine was used for local anesthesia. Contrast was injected through all 3 drains. We first addressed the displaced left-sided drain. Catheter was amputated and 035 wire was advanced through the drain, which was removed. Angled Kumpe catheter was then used to manipulate the wire into the duodenum. Wire was removed and contrast was injected confirming location. A standard modified Seldinger technique was used to then place a 14 [place] biliary drain via the left-sided access. Contrast injected confirmed location. We then addressed the superior right internal external drain. Catheter was injected confirming location. Catheter was amputated and modified Seldinger technique was used to place a new 48 [place] internal external biliary drain. We then address the inferior right-sided drain. Contrast was injected confirming location. Catheter was amputated and we exchanged for a new 10 [place] internal/external biliary drain. Contrast confirmed location. All 3 drains were sutured in position and a final image was stored. IMPRESSION: Status post exchange and up size of displaced left internal/external biliary drain, and routine exchange of 2 right-sided internal/external biliary drains as above. Signed, Dulcy Fanny. Nadene Rubins, RPVI [facility] Radiology Electronically Signed   By: Corrie Mckusick D.O.   On: 09/16/2022 09:05    Microbiology: Results for  orders placed or performed during the hospital encounter of 09/28/22  Urine Culture     Status: Abnormal   Collection Time: 09/29/22  8:11 AM   Specimen: Urine, Clean Catch  Result Value Ref Range Status   Specimen Description   Final    URINE, CLEAN CATCH Performed at [facility], [place] [address] [place], [place] [address]    Special Requests   Final    NONE Performed at [facility], [place] [address] [place], [place] [address]    Culture (A)  Final    <10,000 COLONIES/mL INSIGNIFICANT GROWTH Performed at [place] [address], [place], [place] [address]    Report Status 10/01/2022 FINAL  Final  Culture, blood (Routine X 2) w Reflex to ID Panel     Status: None (Preliminary result)   Collection Time: 09/29/22  3:22 PM   Specimen: BLOOD  Result Value Ref Range Status   Specimen Description   Final    BLOOD BLOOD LEFT HAND Performed at [place] [address], [place], [place] [address]    Special Requests   Final    BOTTLES DRAWN AEROBIC ONLY Blood Culture results may not be optimal due to an inadequate volume of blood received in culture bottles Performed at [place] [address], [place], [place] [address]    Culture   Final    NO GROWTH 3 DAYS Performed at [facility], [place] [address], [place],  [address]    Report Status PENDING  Incomplete  Culture, blood (Routine X 2) w  Reflex to ID Panel     Status: None (Preliminary result)   Collection Time: 09/29/22  6:25 PM   Specimen: BLOOD  Result Value Ref Range Status   Specimen Description   Final    BLOOD PICC LINE Performed at [facility], [place] [address], [place], [place] [address]    Special Requests   Final    BOTTLES DRAWN AEROBIC AND ANAEROBIC Blood Culture adequate volume Performed at [place] [address], [place], [place] [address]    Culture   Final     NO GROWTH 3 DAYS Performed at [facility], [place] [address] [place], [place] [address]    Report Status PENDING  Incomplete    Labs: CBC: Recent Labs  Lab 09/29/22 0529 09/30/22 0500  WBC 20.8* 22.6*  NEUTROABS  --  17.3*  HGB 9.5* 8.8*  HCT 29.2* 27.0*  MCV 87.7 85.2  PLT 335 034   Basic Metabolic Panel: Recent Labs  Lab 09/29/22 0529 09/30/22 0500  NA 131* 133*  K 4.4 3.9  CL 102 105  CO2 19* 20*  GLUCOSE 103* 146*  BUN 56* 46*  CREATININE 2.71* 2.13*  CALCIUM 8.4* 8.2*  MG 2.1  --    Liver Function Tests: Recent Labs  Lab 09/29/22 0529 09/30/22 0500  AST 27 23  ALT 21 19  ALKPHOS 455* 437*  BILITOT 4.2* 3.9*  PROT 7.3 7.1  ALBUMIN 1.9* 1.7*   Discharge time spent: 35 minutes.  Signed: Cordelia Poche, MD [facility] 10/02/2022

## 2022-10-02 NOTE — Progress Notes (Signed)
IP PROGRESS NOTE  Subjective:   Barbara Byrd reports feeling better.  She is ready to go home.  No nausea.  She has discomfort at the right upper abdomen biliary drain site.  The pain is relieved with oxycodone.  Objective: Vital signs in last 24 hours: Blood pressure (!) 81/49, pulse 85, temperature 98.1 F (36.7 C), temperature source Oral, resp. rate 18, height '5\' 4"'$  (1.626 m), weight 211 lb 3.2 oz (95.8 kg), SpO2 100 %.  Intake/Output from previous day: 10/26 0701 - 10/27 0700 In: 1029.8 [P.O.:240; I.V.:789.8] Out: 783 [Urine:600; Drains:183]  Physical Exam:  HEENT: Scleral icterus, no thrush Abdomen: Left and right upper abdominal biliary drains, the abdomen is soft Extremities: No leg edema Cardiac: Regular rate and rhythm   Lab Results: Recent Labs    09/30/22 0500  WBC 22.6*  HGB 8.8*  HCT 27.0*  PLT 309    BMET Recent Labs    09/30/22 0500  NA 133*  K 3.9  CL 105  CO2 20*  GLUCOSE 146*  BUN 46*  CREATININE 2.13*  CALCIUM 8.2*    Lab Results  Component Value Date   [id number] (H) 04/20/2022    Studies/Results: No results found.  Medications: I have reviewed the patient's current medications.  Assessment/Plan: Common bile duct cholangiocarcinoma-stage IIb,pT2pN1 Presenting with obstructive jaundice summer 2019 Elevated preoperative CA 19-9 ERCP/EUS 09/02/2018-stenosis and lower third of the main bile duct, mass in the uncinate of the pancreas,-biopsy revealed minute focus of atypical cells Pancreaticoduodenectomy 11/15/2018,pT2pN1 moderately differentiated adenocarcinoma of the common bile duct, 2/35 lymph nodes, perineural invasion Patient reports 6 months of adjuvant gemcitabine/capecitabine Seen with complaint of abdominal pain 02/23/2022, CA 19-9 elevated, referred for CTs CTs 02/27/2022 status post Whipple without specific evidence of recurrent or metastatic disease.  Tiny pulmonary nodules not readily apparent on the outside CT of 07/27/2019.   No thoracic adenopathy. Enlarged heterogeneous left lobe of the thyroid grossly similar to the prior exam. PET 6/96/2952-[id number] hypermetabolic liver lesions consistent with metastases, mild uptake involving a right common iliac lymph node MRI abdomen 05/17/2022-lesions in the right liver noted on PET are not well defined on MRI, left and right intrahepatic biliary dilatation, suggesting obstruction at the confluence of the left and right hepatic ducts, hypoenhancing lesion at this level measuring 10 x 12 mm.  Central hypermetabolic activity in this region on the PET, no evidence of nodal disease 06/02/2022 cholangiogram and placement of a biliary drain by interventional radiology-central intrahepatic biliary dilatation but minimal peripheral biliary dilatation; right hepatic lobe demonstrated irregular dilated central bile ducts with drainage through the surgical anastomosis into the small bowel; narrowing at the surgical anastomosis; dilated left hepatic bile duct demonstrated filling defects within the bile duct; contrast eventually drained into the small bowel through the hepaticojejunostomy.  Cytology on the biliary tract, bile, biliary drain suspicious for malignancy. 06/15/2022-cholangiogram confirmed central left biliary stenosis, biliary brush biopsy-adenocarcinoma, left biliary drain exchanged and upsized  CT abdomen/pelvis 06/27/2022-numerous small pulmonary nodules in the included bilateral lung bases which are new and enlarged compared to exam dated 02/27/2022.  Interval placement of left-sided percutaneous biliary drain.  Similar intrahepatic biliary ductal dilatation when compared to prior MRI most notable left lobe of the liver. 07/02/2022 biliary drain exchanged on the left and new drain placed on the right 07/28/2022 new right biliary internal/external drain placed; exchange of prior left and right internal/external biliary drain. 08/26/2022-right inferior biliary drain placed back to bag  drainage.  Right superior and left biliary  drains patent, capped. 09/15/2022-exchange and upsize of displaced left internal/external biliary drain and routine exchange of 2 right-sided internal/external biliary drains 2.  Hypertension 3.  Hyperlipidemia 4.  COVID-19 2021 5.  06/02/2022 admitted with rigors, bradycardia, hypotension, nausea/vomiting following placement of the biliary drain, likely acute cholangitis, blood culture positive for E. coli, discharged home on levofloxacin 6.  Admission 09/28/2022 with nausea, failure to thrive, and dehydration   Barbara Byrd appears unchanged.  She plans to return home with hospice care.  She has hypotension this morning. Cultures remain negative and she is afebrile.  She appears stable for discharge to home when the blood pressure improves.  I will plan to serve as the primary provider with the home hospice team.  Recommendations: 1.  Home with hospice care, I will plan to serve as the primary provider with hospice 2.  Outpatient follow-up will be scheduled at the Cancer center 3.  Continue tramadol as needed for pain 4.  Evaluation of hypotension per the medical service 5.  Please call Oncology as needed        LOS: 4 days   [name], MD   10/02/2022, 2:00 PM

## 2022-10-02 NOTE — Assessment & Plan Note (Signed)
Discharge with midodrine 10 mg TID.

## 2022-10-02 NOTE — Progress Notes (Signed)
Oncology Discharge Planning Note  [facility] at [place] Address: [place], [place], [place] [address] Hours of Operation:  Nena Polio, Monday - Friday  Clinic Contact Information:  [phone]  Oncology Care Team: Medical Oncologist:  Benay Spice  Patient Details: Name:  Barbara Byrd, Barbara Byrd MRN:   [id number] DOB:   10-05-1941 Reason for Current Admission: '@PPROB'$ @  Discharge Planning Narrative: Notification of admission received by inpatient team for [facility].  Discharge follow-up appointments for oncology are current and available on the AVS and MyChart.   Upon discharge from the hospital, hematology/oncology's post discharge plan of care for the outpatient setting is: October 16, 2022 at [place] at [place],  [id number] Tony will be called within two business days after discharge to review hematology/oncology's plan of care for full understanding.    Outpatient Oncology Specific Care Only: Oncology appointment transportation needs addressed?:  no Oncology medication management for symptom management addressed?:  yes Chemo Alert Card reviewed?:  yes Immunotherapy Alert Card reviewed?:  no

## 2022-10-03 LAB — BLOOD CULTURE ID PANEL (REFLEXED) - BCID2

## 2022-10-04 ENCOUNTER — Other Ambulatory Visit: Payer: Self-pay | Admitting: Oncology

## 2022-10-04 LAB — CULTURE, BLOOD (ROUTINE X 2): Culture: NO GROWTH

## 2022-10-04 MED ORDER — CIPROFLOXACIN HCL 500 MG PO TABS: 500.0000 mg | ORAL_TABLET | Freq: Two times a day (BID) | ORAL | 0 refills | Status: AC

## 2022-10-04 NOTE — Progress Notes (Signed)
Palliative Medicine Progress Note   Patient Name: Barbara Byrd       Date: 10/04/2022 DOB: September 26, 1941  Age: 81 y.o. MRN#: [id number] Attending Physician: No att. providers found Primary Care Physician: Malena Peer, MD Admit Date: 09/28/2022    HPI/Patient Profile: 81 y.o. female  with past medical history of metastatic cholangiocarcinoma, hypertension, hyperlipidemia, and SVT who presented to [facility] as a direct admit from the cancer center on 09/28/2022 with failure to thrive.  She is admitted to [facility] service.  Palliative Medicine has been consulted for goals of care    Subjective: Chart reviewed. Creatinine remains elevated but has improved. Per oncology note 10/25, patient does not appear to be a candidate for chemotherapy. Plan is to submit tissue for testing to see whether she is a candidate for immunotherapy, but this is unlikely. Referral was made to [facility] hospice.   I spoke with patient's sister Barbara Byrd by phone. Reviewed plan for discharge home with hospice, likely today. Barbara Byrd reports she has not spoken with anyone from hospice. I let her know that the liaison had attempted to call yesterday afternoon, but was unable to reach her. I offered to contact the liaison and have her call Barbara Byrd as soon as possible.  I also informed Barbara Byrd that patient has an oncology follow-up appointment scheduled for [place] at [facility] 11/10 at [place].   I later visited Barbara Byrd at bedside. She reports feeling tired, but otherwise has no acute complaints.    Objective:  Physical Exam Vitals reviewed.  Constitutional:      General: She is not in acute distress.    Appearance: She is ill-appearing.     Comments: drowsy  Pulmonary:     Effort: Pulmonary effort is normal.  Neurological:      Motor: Weakness present.             Vital Signs: BP 90/61 (BP Location: Left Wrist)   Pulse 77   Temp 97.7 F (36.5 C) (Oral)   Resp 20   Ht '5\' 4"'$  (1.626 m)   Wt 95.8 kg   SpO2 100%   BMI 36.25 kg/m  SpO2: SpO2: 100 % O2 Device: O2 Device: Room Air O2 Flow Rate:    Intake/output summary: No intake or output data in the 24 hours ending 10/04/22 0812  LBM: Last  BM Date : 10/02/22      Palliative Medicine Assessment & Plan   Assessment: Principal Problem:   Acute renal failure superimposed on stage 3a chronic kidney disease (HCC) Active Problems:   Cholangiocarcinoma (HCC)   Hypertension   Hyperlipidemia   FTT (failure to thrive) in adult   Pressure injury of skin   Leukocytosis   Obesity (BMI 30-39.9)   Hyponatremia   Hospice care patient   Hypotension    Recommendations/Plan: DNR/DNI as previously documented Pending discharge home today with hospice   Prognosis:  < 6 months   Thank you for allowing the Palliative Medicine Team to assist in the care of this patient.   MDM - Moderate   Lavena Bullion, NP   Please contact Palliative Medicine Team phone at [phone] for questions and concerns.  For individual providers, please see AMION.

## 2022-10-05 ENCOUNTER — Encounter: Payer: Self-pay | Admitting: *Deleted

## 2022-10-05 LAB — CULTURE, BLOOD (ROUTINE X 2): Special Requests: ADEQUATE

## 2022-10-07 ENCOUNTER — Telehealth: Payer: Self-pay

## 2022-10-07 NOTE — Telephone Encounter (Signed)
Barbara Byrd from [facility] reports Barbara Byrd passed away at home around 1700 with family and friend peaceful and comfortable.

## 2022-10-07 DEATH — deceased

## 2022-10-13 ENCOUNTER — Encounter: Payer: Self-pay | Admitting: *Deleted

## 2022-10-13 NOTE — Progress Notes (Unsigned)
Received fax from [place] that patient died on 10-29-22 at 5:55 PM. MD is aware.

## 2022-10-16 ENCOUNTER — Inpatient Hospital Stay: Payer: Medicare HMO | Admitting: Nurse Practitioner

## 2022-10-19 ENCOUNTER — Ambulatory Visit: Payer: Medicare HMO | Admitting: Podiatry
